# Patient Record
Sex: Female | Born: 1950 | Race: White | Hispanic: No | Smoking: Former smoker
Health system: Southern US, Community
[De-identification: ages and names within clinical notes are randomized; demographics above are authoritative.]

## PROBLEM LIST (undated history)

## (undated) DIAGNOSIS — IMO0001 Reserved for inherently not codable concepts without codable children: Secondary | ICD-10-CM

## (undated) DIAGNOSIS — Z5189 Encounter for other specified aftercare: Secondary | ICD-10-CM

## (undated) DIAGNOSIS — E669 Obesity, unspecified: Secondary | ICD-10-CM

## (undated) DIAGNOSIS — F988 Other specified behavioral and emotional disorders with onset usually occurring in childhood and adolescence: Secondary | ICD-10-CM

## (undated) DIAGNOSIS — N200 Calculus of kidney: Secondary | ICD-10-CM

## (undated) DIAGNOSIS — B182 Chronic viral hepatitis C: Secondary | ICD-10-CM

## (undated) HISTORY — DX: Calculus of kidney: N20.0

## (undated) HISTORY — DX: Chronic viral hepatitis C: B18.2

## (undated) HISTORY — DX: Encounter for other specified aftercare: Z51.89

## (undated) HISTORY — DX: Reserved for inherently not codable concepts without codable children: IMO0001

## (undated) HISTORY — DX: Other specified behavioral and emotional disorders with onset usually occurring in childhood and adolescence: F98.8

## (undated) HISTORY — DX: Obesity, unspecified: E66.9

## (undated) HISTORY — PX: RHINOPLASTY: SUR1284

---

## 1979-07-28 HISTORY — PX: OTHER SURGICAL HISTORY: SHX169

## 1998-12-13 ENCOUNTER — Other Ambulatory Visit: Admission: RE | Admit: 1998-12-13 | Discharge: 1998-12-13 | Payer: Self-pay | Admitting: *Deleted

## 2000-04-13 ENCOUNTER — Other Ambulatory Visit: Admission: RE | Admit: 2000-04-13 | Discharge: 2000-04-13 | Payer: Self-pay | Admitting: *Deleted

## 2000-06-10 ENCOUNTER — Other Ambulatory Visit: Admission: RE | Admit: 2000-06-10 | Discharge: 2000-06-10 | Payer: Self-pay | Admitting: *Deleted

## 2000-07-22 ENCOUNTER — Other Ambulatory Visit: Admission: RE | Admit: 2000-07-22 | Discharge: 2000-07-22 | Payer: Self-pay | Admitting: *Deleted

## 2000-07-22 ENCOUNTER — Encounter (INDEPENDENT_AMBULATORY_CARE_PROVIDER_SITE_OTHER): Payer: Self-pay

## 2001-01-28 ENCOUNTER — Other Ambulatory Visit: Admission: RE | Admit: 2001-01-28 | Discharge: 2001-01-28 | Payer: Self-pay | Admitting: *Deleted

## 2002-06-27 ENCOUNTER — Other Ambulatory Visit: Admission: RE | Admit: 2002-06-27 | Discharge: 2002-06-27 | Payer: Self-pay | Admitting: *Deleted

## 2002-12-18 ENCOUNTER — Other Ambulatory Visit: Admission: RE | Admit: 2002-12-18 | Discharge: 2002-12-18 | Payer: Self-pay | Admitting: *Deleted

## 2004-02-27 ENCOUNTER — Emergency Department (HOSPITAL_COMMUNITY): Admission: EM | Admit: 2004-02-27 | Discharge: 2004-02-27 | Payer: Self-pay | Admitting: Emergency Medicine

## 2005-12-06 ENCOUNTER — Emergency Department (HOSPITAL_COMMUNITY): Admission: EM | Admit: 2005-12-06 | Discharge: 2005-12-06 | Payer: Self-pay | Admitting: Emergency Medicine

## 2006-04-06 ENCOUNTER — Ambulatory Visit: Payer: Self-pay | Admitting: Gastroenterology

## 2006-05-10 ENCOUNTER — Encounter (INDEPENDENT_AMBULATORY_CARE_PROVIDER_SITE_OTHER): Payer: Self-pay | Admitting: *Deleted

## 2006-05-10 ENCOUNTER — Ambulatory Visit (HOSPITAL_COMMUNITY): Admission: RE | Admit: 2006-05-10 | Discharge: 2006-05-10 | Payer: Self-pay | Admitting: Gastroenterology

## 2006-05-21 ENCOUNTER — Ambulatory Visit: Payer: Self-pay | Admitting: Gastroenterology

## 2006-07-01 ENCOUNTER — Ambulatory Visit: Payer: Self-pay | Admitting: Gastroenterology

## 2007-12-07 ENCOUNTER — Encounter: Admission: RE | Admit: 2007-12-07 | Discharge: 2007-12-07 | Payer: Self-pay | Admitting: Internal Medicine

## 2008-01-24 ENCOUNTER — Encounter: Admission: RE | Admit: 2008-01-24 | Discharge: 2008-01-24 | Payer: Self-pay | Admitting: Gastroenterology

## 2008-05-10 ENCOUNTER — Ambulatory Visit: Payer: Self-pay | Admitting: Gastroenterology

## 2008-05-22 ENCOUNTER — Ambulatory Visit: Payer: Self-pay | Admitting: Gastroenterology

## 2008-07-10 ENCOUNTER — Ambulatory Visit: Payer: Self-pay | Admitting: Gastroenterology

## 2008-07-24 ENCOUNTER — Ambulatory Visit: Payer: Self-pay | Admitting: Gastroenterology

## 2008-08-07 ENCOUNTER — Ambulatory Visit: Payer: Self-pay | Admitting: Gastroenterology

## 2008-09-04 ENCOUNTER — Ambulatory Visit: Payer: Self-pay | Admitting: Gastroenterology

## 2008-10-02 ENCOUNTER — Ambulatory Visit: Payer: Self-pay | Admitting: Gastroenterology

## 2008-10-30 ENCOUNTER — Ambulatory Visit: Payer: Self-pay | Admitting: Gastroenterology

## 2008-11-29 ENCOUNTER — Ambulatory Visit: Payer: Self-pay | Admitting: Gastroenterology

## 2009-02-05 ENCOUNTER — Ambulatory Visit: Payer: Self-pay | Admitting: Gastroenterology

## 2009-05-09 ENCOUNTER — Ambulatory Visit: Payer: Self-pay | Admitting: Gastroenterology

## 2009-09-06 ENCOUNTER — Ambulatory Visit: Payer: Self-pay | Admitting: Family Medicine

## 2009-09-27 ENCOUNTER — Ambulatory Visit: Payer: Self-pay | Admitting: Family Medicine

## 2009-09-30 ENCOUNTER — Encounter: Admission: RE | Admit: 2009-09-30 | Discharge: 2009-09-30 | Payer: Self-pay | Admitting: Family Medicine

## 2009-11-07 ENCOUNTER — Ambulatory Visit: Payer: Self-pay | Admitting: Gastroenterology

## 2010-10-24 ENCOUNTER — Encounter (INDEPENDENT_AMBULATORY_CARE_PROVIDER_SITE_OTHER): Admitting: Family Medicine

## 2010-10-24 DIAGNOSIS — Z23 Encounter for immunization: Secondary | ICD-10-CM

## 2010-10-24 DIAGNOSIS — B1710 Acute hepatitis C without hepatic coma: Secondary | ICD-10-CM

## 2010-10-24 DIAGNOSIS — Z79899 Other long term (current) drug therapy: Secondary | ICD-10-CM

## 2010-10-24 DIAGNOSIS — F988 Other specified behavioral and emotional disorders with onset usually occurring in childhood and adolescence: Secondary | ICD-10-CM

## 2010-10-24 DIAGNOSIS — E669 Obesity, unspecified: Secondary | ICD-10-CM

## 2010-12-12 NOTE — Op Note (Signed)
Barbara Cunningham, Barbara Cunningham            ACCOUNT NO.:  [id number]   MEDICAL RECORD NO.:  [id number]          PATIENT TYPE:  AMB   LOCATION:  ENDO                         FACILITY:  [facility]   PHYSICIAN:  Anselmo Rod, M.D.  DATE OF BIRTH:  11/18/1950   DATE OF PROCEDURE:  05/10/2006  DATE OF DISCHARGE:  05/10/2006                                 OPERATIVE REPORT   OPERATIVE PROCEDURE:  Colonoscopy with snare polypectomy x1.   ENDOSCOPIST:  Anselmo Rod, M.D.   INSTRUMENT USED:  Olympus Video Colonoscopy.   INDICATIONS FOR PROCEDURE:  A 60 year old white female with a personal  history of hepatitis C undergoing screening colonoscopy to rule out colonic  polyps, masses, etc.   PRE-PROCEDURE PREPARATION:  Informed consent was procured from the patient.  The patient fasted for four hours prior to the procedure and prepped with 20  OsmoPrep overnight and 12 OsmoPrep on the morning of the procedure.  Risks  and benefits of the procedure including a 10% missed rate of cancer or polyp  were discussed with the patient as well.   PRE-PROCEDURE PHYSICAL:  The patient had stable vital signs.  Neck supple.  Chest clear to auscultation.  S1 and S2 regular.  Abdomen soft with normal  bowel sounds.   DESCRIPTION OF PROCEDURE:  The patient was placed in the left lateral  decubitus position, sedated with 100 mcg of Fentanyl and 10 mg of Versed in  incremental doses.  Once the patient was adequately sedated, maintained on  low flow oxygen and continuous cardiac monitoring the Olympus video  colonoscope was advanced from the rectum to the cecum.  The patient had a  poor prep.  Visualization was inadequate.  Multiple washes were done.  The  patient's position was changed from the left lateral to the supine position  to reach the cecal base.  A flat polyp was snared from the distal right  colon (hot snare x1).  No other masses or polyps were seen.  There was no  evidence of diverticulosis.   Retroflexion in the rectum revealed no  abnormalities.  The patient tolerated the procedure well without immediate  complications.   IMPRESSION:  1. Flat polyps snared from distal right colon.  2. No evidence of diverticulosis.  3. Large amount of residual stool in the colon.  Small lesions could be      missed.   RECOMMENDATIONS:  1. Await pathology results.  2. Avoid all nonsteroidal's including aspirin for the next two weeks.  3. Repeat colonoscopy depending on pathology results.  4. Outpatient follow-up as the need arises in the future.      Anselmo Rod, M.D.  Electronically Signed     JNM/MEDQ  D:  05/11/2006  T:  05/13/2006  Job:  [id number]   cc:   Olene Craven, M.D.

## 2010-12-29 ENCOUNTER — Telehealth: Payer: Self-pay | Admitting: Family Medicine

## 2010-12-29 MED ORDER — AMPHETAMINE-DEXTROAMPHETAMINE 20 MG PO TABS: 10.0000 mg | ORAL_TABLET | Freq: Every day | ORAL | Status: DC

## 2010-12-29 NOTE — Telephone Encounter (Signed)
Adderall renewed.

## 2011-01-08 ENCOUNTER — Ambulatory Visit (INDEPENDENT_AMBULATORY_CARE_PROVIDER_SITE_OTHER): Admitting: Medical

## 2011-01-08 ENCOUNTER — Encounter: Payer: Self-pay | Admitting: Medical

## 2011-01-08 VITALS — BP 120/84 | HR 64 | Temp 97.8°F | Ht 66.0 in | Wt 268.0 lb

## 2011-01-08 DIAGNOSIS — N23 Unspecified renal colic: Secondary | ICD-10-CM

## 2011-01-08 DIAGNOSIS — R109 Unspecified abdominal pain: Secondary | ICD-10-CM

## 2011-01-08 LAB — POCT URINALYSIS DIPSTICK
Glucose, UA: NEGATIVE
pH, UA: 5

## 2011-01-08 MED ORDER — OXYCODONE-ACETAMINOPHEN 5-325 MG PO TABS: 1.0000 | ORAL_TABLET | Freq: Four times a day (QID) | ORAL | Status: DC | PRN

## 2011-01-08 MED ORDER — PROMETHAZINE HCL 25 MG PO TABS: 25.0000 mg | ORAL_TABLET | Freq: Four times a day (QID) | ORAL | Status: AC | PRN

## 2011-01-08 NOTE — Progress Notes (Signed)
  Subjective:   HPI  Barbara Cunningham is a 60 y.o. female who presents for 2 day history of intermittent moderate to severe left flank pain, and nothing improves the pain. The pain can occur regardless of position or motion, and feels deep. She notes a history of renal stones, had a procedure to remove stones back in 1981, and she notes over 15 episodes of renal stones in the past, always on the left. At times the pain is excruciating, feels like her kidney will explode. She denies any recent heavy lifting or strenuous activity, no recent trauma or injury. She denies vaginal symptoms or GI symptoms.  No other aggravating or relieving factors.  No other c/o.  The following portions of the patient's history were reviewed and updated as appropriate: allergies, current medications, past family history, past medical history, past social history, past surgical history and problem list.  Past Medical History  Diagnosis Date  . Hepatitis c, chronic   . ADD (attention deficit disorder)   . Obesity   . Renal stone     Chronic, 15+ prior    Review of Systems Constitutional: denies fever, chills, sweats, unexpected weight change, anorexia, fatigue Dermatology: denies rash Cardiology: denies chest pain, palpitations, edema Respiratory: denies cough, shortness of breath, wheezing Gastroenterology: + Left flank pain, one episode of nausea and vomiting yesterday. denies diarrhea, constipation Hematology: denies bleeding or bruising problems Musculoskeletal: denies arthralgias, myalgias, joint swelling Urology: denies dysuria, difficulty urinating, hematuria, urinary frequency, urgency Neurology: no headache, weakness, tingling, numbness    Objective:   Physical Exam  Filed Vitals:   01/08/11 1131  BP: 120/84  Pulse: 64  Temp: 97.8 F (36.6 C)    General appearance: alert, no distress, WD/WN, in pain at times, obese female Heart: RRR, normal S1, S2, no murmurs Lungs: CTA bilaterally, no  wheezes, rhonchi, or rales Abdomen: +bs, soft, non tender, non distended, no masses, no hepatomegaly, no splenomegaly Back: non tender, but in pain at times regardless of position Pulses: 2+ symmetric, upper and lower extremities, normal cap refill Neurological: strength normal upper extremities and lower extremities   Assessment :    Encounter Diagnoses  Name Primary?  . Renal colic on left side Yes  . Abdominal pain      Plan:    Her urinalysis is unremarkable today. She was here in March of 2011 with very similar history, exam, and urine findings. However, she had a CT abdomen and pelvis 09/30/09 with left nephrolithiasis. She had recent lab work here including renal studies and blood count which were normal. I suspect she has recurrence currently. I advised plenty of water for hydration, gave prescription for pain and antinausea medication, advised if worse over the next 2-3 days call, return, or go to the emergency department. Given her history of recurrent renal stones, we will refer to [facility].  She saw a urologist prior, and other studies were recommended, but at that time she was having studies for her hepatitis C and her renal colic symptoms resolved, thus she didn't pursue further workup at that time.

## 2011-01-08 NOTE — Patient Instructions (Signed)
Ureteral Colic (Kidney Stones) Your pain is caused by a stone located in the urinary tract system. These are the channels that collect the urine from your kidneys and deliver it to your bladder. These stones usually pass through the urinary tract and are expelled in the urine. The intense pain is caused by stone movement in those channels when the ureter goes into spasm around the stone. This pain (ureteral colic) is an intermittent pain. It comes and goes as the ureter contracts around the stone. The pain is usually located in the back and abdomen (belly). It is an intense, sharp, stabbing pain. It begins up high near the kidney and gradually moves down the ureter. When the stone is ready to pass into the bladder the pain is often located in the lower abdomen on the side the stone is located. Once the pain is located here it often passes into the bladder and disappears completely. Once the stone is in the bladder, it can pass out in the urine. This happens because the opening out of the bladder is larger than the path the stone has come down through the ureters. While the stone is passing, some relief can be obtained by pressing down on the area of pain with your hands or pressing a pillow down over the area. There is no certain position you can get in to give yourself pain relief. TREATMENT  Your caregiver will provide you with medications for pain relief.   You may require specialized follow-up x-rays. The absence of pain does not always mean that the stone has passed. It may have just stopped moving. If the urine remains completely obstructed, it can cause loss of kidney function or even complete destruction of the involved kidney. It is your responsibility and in your interest that x-rays and follow-ups as suggested by your caregiver are completed. Relief of pain without passage of the stone can be associated with severe damage to the kidney, including loss of kidney function on that side.   If your  stone does not pass on its own, additional measures may be taken by your caregiver to ensure its removal.  HOME CARE INSTRUCTIONS  Increase your fluid intake.   Strain all urine. A strainer will be provided. Keep all particulate matter or stones for your caregiver to inspect. In spite of the intense pain the stone causes it may be smaller than a BB (4.35 mm in diameter).   Take your pain medication as directed.   Make a follow-up appointment with your caregiver as directed.   Remember that the goal is passage of your stone. The absence of pain in and of itself is not the endpoint. Silent obstruction can and does happen. To avoid the serious consequences of such obstruction, it is absolutely necessary to abide by the plan to which you and your physician have agreed.  SEEK MEDICAL CARE IF:  Pain cannot be controlled with the prescribed medication.   Fever develops. Backed up urine is more likely to become infected. Only take over-the-counter or prescription medicines for pain, discomfort, or fever as directed by your caregiver.   Pain continues for more than 101.   There is a change in the pain, and you develop chest discomfort or constant abdominal pain.   You feel faint or pass out.  MAKE SURE YOU:   Understand these instructions.   Will watch your condition.   Will get help right away if you are not doing well or get worse.  Document Released:  04/22/2005 Document Re-Released: 05/10/2009 [employer] Patient Information 2011 [place], [place].

## 2011-01-10 LAB — URINE CULTURE: Colony Count: 30000

## 2011-01-12 ENCOUNTER — Telehealth: Payer: Self-pay | Admitting: *Deleted

## 2011-01-12 NOTE — Telephone Encounter (Addendum)
Message copied by Dorthula Perfect on Mon Jan 12, 2011 11:09 AM ------      Message from: Jac Canavan      Created: Mon Jan 12, 2011  8:19 AM       Urine culture was + for infection.  Call and see if she feels any better, worse, unchanged?  Call out Macrobid 100mg  BID x 7 days #14, no refill.   Pt notified of urine culture.  Called in  Macrobid 100mg  BID for 7 days #14 with no refills to [facility] at [phone].  CM, LPN

## 2011-01-19 ENCOUNTER — Other Ambulatory Visit (INDEPENDENT_AMBULATORY_CARE_PROVIDER_SITE_OTHER)

## 2011-01-19 DIAGNOSIS — N390 Urinary tract infection, site not specified: Secondary | ICD-10-CM

## 2011-01-19 DIAGNOSIS — R300 Dysuria: Secondary | ICD-10-CM

## 2011-01-19 LAB — POCT URINALYSIS DIPSTICK
Bilirubin, UA: NEGATIVE
Blood, UA: NEGATIVE
Ketones, UA: NEGATIVE
Spec Grav, UA: 1.02
pH, UA: 5

## 2011-01-20 ENCOUNTER — Telehealth: Payer: Self-pay | Admitting: *Deleted

## 2011-01-20 ENCOUNTER — Other Ambulatory Visit

## 2011-01-20 DIAGNOSIS — M549 Dorsalgia, unspecified: Secondary | ICD-10-CM

## 2011-01-20 MED ORDER — CIPROFLOXACIN HCL 500 MG PO TABS: 500.0000 mg | ORAL_TABLET | Freq: Two times a day (BID) | ORAL | Status: AC

## 2011-01-20 MED ORDER — HYDROCODONE-ACETAMINOPHEN 5-500 MG PO TABS: 1.0000 | ORAL_TABLET | Freq: Three times a day (TID) | ORAL | Status: AC | PRN

## 2011-01-20 NOTE — Telephone Encounter (Signed)
Called in cipro 500 mg and Lortab 5/500 mg to [facility].  Pt aware.  CM LPN

## 2011-01-20 NOTE — Telephone Encounter (Addendum)
Message copied by Dorthula Perfect on Tue Jan 20, 2011 10:10 AM ------      Message from: Aleen Campi, DAVID S      Created: Mon Jan 19, 2011  7:44 PM       i had spoken to pt last wk about ongoing back pain.  She had seen urology who didn't think the stones were the problem, but rather lumbar musculoskeletal issue.  Advised she come in for repeat UA if still having pain.  If negative for UTI and no improvement, then recheck.   At this point her urine just shows some bacteria. Thus, if still in a lot of pain, recheck OV.   Spoke with pt regarding urinalysis results.  Pt c/o same symptoms.  Would like another round of antibiotics.  Will send ua for culture. CM, LPN

## 2011-01-20 NOTE — Telephone Encounter (Signed)
Call out (and order in epic) the following:  A - Cipro 500mg  1 tablet BID x 5 days, #10, no refill.  B - Lortab 5/500mg  1 tablet TID prn pain #15, no refill. If not resolving, return for recheck.

## 2011-01-21 ENCOUNTER — Other Ambulatory Visit: Payer: Self-pay | Admitting: Medical

## 2011-01-21 ENCOUNTER — Telehealth: Payer: Self-pay | Admitting: Medical

## 2011-01-21 MED ORDER — AMPHETAMINE-DEXTROAMPHETAMINE 20 MG PO TABS: 20.0000 mg | ORAL_TABLET | Freq: Two times a day (BID) | ORAL | Status: DC

## 2011-01-21 NOTE — Telephone Encounter (Signed)
rx ready 

## 2011-01-21 NOTE — Telephone Encounter (Signed)
pls pull chart 

## 2011-01-22 LAB — URINE CULTURE: Colony Count: NO GROWTH

## 2011-01-22 NOTE — Telephone Encounter (Signed)
Called pt to pick up rx

## 2011-01-23 ENCOUNTER — Telehealth: Payer: Self-pay | Admitting: *Deleted

## 2011-01-23 NOTE — Telephone Encounter (Addendum)
Message copied by Dorthula Perfect on Fri Jan 23, 2011  9:20 AM ------      Message from: Jac Canavan      Created: Fri Jan 23, 2011  7:42 AM       Urine culture negative.  If still having back/belly pain, recheck/OV   Pt notified of urine culture results.  Pain is easing up and will call to schedule OV if needed.  CM, LPN

## 2011-03-03 ENCOUNTER — Telehealth: Payer: Self-pay | Admitting: Family Medicine

## 2011-03-03 MED ORDER — AMPHETAMINE-DEXTROAMPHETAMINE 20 MG PO TABS: 20.0000 mg | ORAL_TABLET | Freq: Two times a day (BID) | ORAL | Status: DC

## 2011-03-03 NOTE — Telephone Encounter (Signed)
Needs refill Adderall 20mg   #60    1bid.  Pt daughter coming in [place] after lunch.  Can you give her the written rx for her mom.

## 2011-05-08 ENCOUNTER — Telehealth: Payer: Self-pay | Admitting: Family Medicine

## 2011-05-08 MED ORDER — AMPHETAMINE-DEXTROAMPHETAMINE 20 MG PO TABS: 20.0000 mg | ORAL_TABLET | Freq: Two times a day (BID) | ORAL | Status: DC

## 2011-05-08 NOTE — Telephone Encounter (Signed)
Adderall renewed.

## 2011-05-12 ENCOUNTER — Other Ambulatory Visit: Payer: Self-pay

## 2011-05-12 LAB — HM MAMMOGRAPHY: HM Mammogram: ABNORMAL

## 2011-05-13 HISTORY — PX: BREAST BIOPSY: SHX20

## 2011-05-15 ENCOUNTER — Encounter (INDEPENDENT_AMBULATORY_CARE_PROVIDER_SITE_OTHER): Payer: Self-pay | Admitting: General Surgery

## 2011-05-18 ENCOUNTER — Ambulatory Visit (INDEPENDENT_AMBULATORY_CARE_PROVIDER_SITE_OTHER): Admitting: General Surgery

## 2011-05-18 ENCOUNTER — Encounter (INDEPENDENT_AMBULATORY_CARE_PROVIDER_SITE_OTHER): Payer: Self-pay | Admitting: General Surgery

## 2011-05-18 VITALS — BP 142/84 | HR 68 | Temp 97.6°F | Resp 16 | Ht 66.0 in | Wt 271.0 lb

## 2011-05-18 DIAGNOSIS — N6089 Other benign mammary dysplasias of unspecified breast: Secondary | ICD-10-CM

## 2011-05-18 DIAGNOSIS — N6099 Unspecified benign mammary dysplasia of unspecified breast: Secondary | ICD-10-CM

## 2011-05-18 NOTE — Patient Instructions (Addendum)
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 You will be scheduled for surgery to  excisise  this area to be sure that there is no cancer within the breast.  Breast Biopsy WHY YOU NEED A BIOPSY Your caregiver has recommended that you have a breast tissue sample taken (biopsy). This is done to be certain that the lump or abnormality found in your breast is not cancerous (malignant). During a biopsy, a small piece of tissue is removed, so it can be examined under a microscope by a specialist (pathologist) who looks at tissues and cells and diagnoses abnormalities in them. Most lumps (tumors) or abnormalities, on or in the breast, are not cancerous (benign). However, biopsies are taken when your caregiver cannot be absolutely certain of what is wrong only from doing a physical exam, mammogram (breast X-ray), or other studies. A breast biopsy can tell you whether nothing more needs to be done, or you need more surgery or another type of treatment. A biopsy is done when there is:  Any undiagnosed breast mass.   Nipple abnormalities, dimpling, crusting, or ulcerations.   Calcium deposits (calcifications) or abnormalities seen on your mammogram, ultrasound, or MRI.   Suspicious changes in the breast (thickening, asymmetry) seen on mammogram.   Abnormal discharge from the nipple, especially blood.   Redness, swelling, and pain of the breast.  HOW A BIOPSY IS PERFORMED A biopsy is often performed on an outpatient basis (you go home the same day). This can be done in a hospital, clinic, or surgical center. Tissue samples (biopsies) are often done under local anesthesia (area is numbed). Sometimes general anesthetics are required, in which case you sleep through the procedure. Biopsies may remove the entire lump, a small piece of the lump, or a small sliver of tissue removed by needle. TYPES OF BREAST BIOPSY  Fine needle aspiration. A thin needle is placed through the skin, to  the lump or cyst, and cells are removed.   Core needle biopsy. A large needle with a special tip is placed through the skin, to the abnormality, and a piece of tissue is removed.   Stereotactic biopsy. A core needle with a special X-ray is used, to direct the needle to the lump or abnormal area, which is difficult to feel or cannot be felt.   Vacuum-assisted biopsy. A hollow probe and a gentle vacuum remove a sample of tissue.   Ultrasound guided core needle biopsy. You lie on your stomach, with your breast through an opening, and a high frequency ultrasound helps guide the needle to the area of the abnormality.   Open biopsy. An incision is made in the breast, and a piece of the lump or the whole lump is removed.  LET YOUR CAREGIVER KNOW ABOUT:  Allergies.   Medicines taken, including herbs, eye drops, over-the-counter medicines, and creams.   Use of steroids (by mouth or creams).   Previous problems with anesthetics or Novocaine.   If you are taking aspirin or blood thinners.   Possibility of pregnancy, if this applies.   History of blood clots (thrombophlebitis).   History of bleeding or blood problems.   Previous surgery.   Other health problems.  RISKS AND COMPLICATIONS   Bleeding.   Infection.   Allergy to medicines.   Bruising and swelling of the breast.   Alteration in the shape of the breast.   Not finding the lump or abnormality.   Needing more surgery.  BEFORE THE PROCEDURE  You  should arrive 60 minutes prior to your procedure or as directed.   Check-in at the admissions desk, to fill out necessary forms, if you are not preregistered.   There will be consent forms to sign, prior to the procedure.   There is a waiting area for your family, while you are having your biopsy.   Try to have someone with you, to drive you home.   Do not smoke for 2 weeks before the surgery.   Let your caregiver know if you develop a cold or an infection.   Do not  drink alcohol for at least 24 hours before surgery.   Wear a good support bra to the surgery.  AFTER THE PROCEDURE  After surgery, you will be taken to the recovery area, where a nurse will watch and check your progress. Once you are awake, stable, and taking fluids well, if there are no other problems, you will be allowed to go home.   Ice packs applied to your operative site may help with discomfort and keep the swelling down.   You may resume normal diet and activities as directed. Avoid strenuous activities affecting the arm on the side of the biopsy, such as tennis, swimming, heavy lifting (more than 10 pounds) or pulling.   Bruising in the breast is normal following this procedure.   Wearing a support bra, even to bed, may be more comfortable. The bra will also help keep the dressing on.   Change dressings as directed.   Your doctor may apply a pressure dressing on your breast for 24 to 48 hours.   Only take over-the-counter or prescription medicines for pain, discomfort, or fever as directed by your caregiver.   Do not take aspirin, because it can cause bleeding.  HOME CARE INSTRUCTIONS   You may resume your usual diet.   Have someone drive you home after the surgery.   Do not do any exercise, driving, lifting or general activities without your caregiver's permission.   Take medicines and over-the-counter medicines, as ordered by your caregiver.   Keep your postoperative appointments as recommended.   Do not drink alcohol while taking pain medicine.  Finding out the results of your test Not all test results are available during your visit. If your test results are not back during the visit, make an appointment with your caregiver to find out the results. Do not assume everything is normal if you have not heard from your caregiver or the medical facility. It is important for you to follow up on all of your test results.  SEEK MEDICAL CARE IF:   You notice redness,  swelling, or increasing pain in the wound.   You notice a bad smell coming from the wound or dressing.   You develop a rash.   You need stronger pain medicine.   You are having an allergic reaction or problems with your medicines.  SEEK IMMEDIATE MEDICAL CARE IF:   You have difficulty breathing.   You have a fever.   There is increased bleeding (more than a small spot) from the wound.   Pus is coming from the wound.   The wound is breaking open.  Document Released: 07/13/2005 Document Revised: 03/25/2011 Document Reviewed: 05/31/2009 [facility] Patient Information 2012 [place], [place].

## 2011-05-18 NOTE — Progress Notes (Signed)
Chief Complaint  Patient presents with  . Other    new pt- eval lt br atypical ductal hyperplacia    HPI Barbara Cunningham is a 60 y.o. female.    This 60 year old Caucasian female was referred by Dr. 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 Her primary care physician is Dr. Sharlot Gowda..  The patient went for screening mammograms in April. She has no symptoms. She was called back 6 months later for followup. Six-month followup on October 16 shows a 5 mm irregular lobulated density at the 6:00 position of the left breast. 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 She was referred for excision to occlude to exclude occult carcinoma.  Family history is negative for breast cancer and is negative for ovarian cancer.  Patient has a past history of hepatitis C underwent treatment by [facility] . Has no liver decompensation. Other medical issues include the ADD, obesity, kidney stones, it she's had 2 C-sections.   HPI  Past Medical History  Diagnosis Date  . Hepatitis c, chronic   . ADD (attention deficit disorder)   . Obesity   . Renal stone     Chronic, 15+ prior  . Blood transfusion     Past Surgical History  Procedure Date  . Renal stone surgery 1981  . Rhinoplasty   . Cesarean section 1979, 1981    Family History  Problem Relation Age of Onset  . Vision loss Father     Social History History  Substance Use Topics  . Smoking status: Former Games developer  . Smokeless tobacco: Never Used   Comment: quit 2008  . Alcohol Use: No    No Known Allergies  Current Outpatient Prescriptions  Medication Sig Dispense Refill  . amphetamine-dextroamphetamine (ADDERALL, 20MG ,) 20 MG tablet Take 1 tablet (20 mg total) by mouth 2 (two) times daily.  60 tablet  0    Review of Systems Review of Systems  Constitutional: Negative for fever, chills and unexpected weight change.  HENT: Negative for  hearing loss, congestion, sore throat, trouble swallowing and voice change.   Eyes: Negative for visual disturbance.  Respiratory: Negative for cough and wheezing.   Cardiovascular: Negative for chest pain, palpitations and leg swelling.  Gastrointestinal: Negative for nausea, vomiting, abdominal pain, diarrhea, constipation, blood in stool, abdominal distention and anal bleeding.  Genitourinary: Negative for hematuria, vaginal bleeding and difficulty urinating.  Musculoskeletal: Negative for arthralgias.  Skin: Negative for rash and wound.  Neurological: Negative for seizures, syncope and headaches.  Hematological: Negative for adenopathy. Does not bruise/bleed easily.       Blood transfusion 1976.  Psychiatric/Behavioral: Negative for confusion.    Blood pressure 142/84, pulse 68, temperature 97.6 F (36.4 C), temperature source Temporal, resp. rate 16, height 5\' 6"  (1.676 m), weight 271 lb (122.925 kg).  Physical Exam Physical Exam  Constitutional: She is oriented to person, place, and time. She appears well-developed and well-nourished. No distress.  HENT:  Head: Normocephalic and atraumatic.  Nose: Nose normal.  Mouth/Throat: No oropharyngeal exudate.  Eyes: Conjunctivae and EOM are normal. Pupils are equal, round, and reactive to light. Left eye exhibits no discharge. No scleral icterus.  Neck: Neck supple. No JVD present. No tracheal deviation present. No thyromegaly present.  Cardiovascular: Normal rate, regular rhythm, normal heart sounds and intact distal pulses.   No murmur heard. Pulmonary/Chest: Effort normal and breath sounds normal. No respiratory distress. She has no wheezes.  She has no rales. She exhibits no tenderness.       No breast mass, nipple discharge, skin change or adenopathy.  Abdominal: Soft. Bowel sounds are normal. She exhibits no distension and no mass. There is no tenderness. There is no rebound and no guarding.       pfannensteil scar.    Musculoskeletal: She exhibits no edema and no tenderness.  Lymphadenopathy:    She has no cervical adenopathy.  Neurological: She is alert and oriented to person, place, and time. She exhibits normal muscle tone. Coordination normal.  Skin: Skin is warm. No rash noted. She is not diaphoretic. No erythema. No pallor.  Psychiatric: She has a normal mood and affect. Her behavior is normal. Judgment and thought content normal.    Data Reviewed    Assessment   Left breast mass 6:00 position. Atypical ductal hyperplasia by image guided biopsy. Complete excision is indicated to exclude carcinoma.  Obesity  Hepatitis C  Obesity  ADD.     Plan    We'll schedule for left partial mastectomy with needle localization in the near future. This may require a radially oriented vertical incision to prevent deformity.  I have discussed indications and details of surgery with her. Risk and complications have been outlined, including but not limited to bleeding, infection, reoperation for cancer, cosmetic deformity, nerve damage with chronic pain or numbness, cardiac pulmonary and thromboembolic problems. She seems to understand these issues well. The tunnel for questions are answered. She is in full agreement with this plan.       Antavious Spanos M 05/18/2011, 4:21 PM

## 2011-05-20 ENCOUNTER — Encounter (INDEPENDENT_AMBULATORY_CARE_PROVIDER_SITE_OTHER): Payer: Self-pay

## 2011-06-26 ENCOUNTER — Ambulatory Visit (HOSPITAL_BASED_OUTPATIENT_CLINIC_OR_DEPARTMENT_OTHER): Admission: RE | Admit: 2011-06-26 | Source: Ambulatory Visit | Admitting: General Surgery

## 2011-06-26 ENCOUNTER — Encounter (HOSPITAL_BASED_OUTPATIENT_CLINIC_OR_DEPARTMENT_OTHER): Admission: RE | Payer: Self-pay | Source: Ambulatory Visit

## 2011-06-26 ENCOUNTER — Ambulatory Visit (HOSPITAL_BASED_OUTPATIENT_CLINIC_OR_DEPARTMENT_OTHER): Admit: 2011-06-26 | Payer: Self-pay | Admitting: General Surgery

## 2011-06-26 SURGERY — MASTECTOMY PARTIAL
Anesthesia: General | Laterality: Left

## 2011-08-21 ENCOUNTER — Telehealth: Payer: Self-pay | Admitting: Family Medicine

## 2011-08-21 NOTE — Telephone Encounter (Signed)
Pt called wants refill on Adderall.  [phone]

## 2011-08-21 NOTE — Telephone Encounter (Signed)
Needs OV.  

## 2011-08-24 ENCOUNTER — Ambulatory Visit (INDEPENDENT_AMBULATORY_CARE_PROVIDER_SITE_OTHER): Admitting: Medical

## 2011-08-24 ENCOUNTER — Encounter: Payer: Self-pay | Admitting: Medical

## 2011-08-24 VITALS — BP 122/80 | HR 62 | Temp 98.1°F | Resp 16 | Wt 270.0 lb

## 2011-08-24 DIAGNOSIS — R6889 Other general symptoms and signs: Secondary | ICD-10-CM

## 2011-08-24 DIAGNOSIS — B1920 Unspecified viral hepatitis C without hepatic coma: Secondary | ICD-10-CM

## 2011-08-24 DIAGNOSIS — N6459 Other signs and symptoms in breast: Secondary | ICD-10-CM | POA: Insufficient documentation

## 2011-08-24 DIAGNOSIS — Z8619 Personal history of other infectious and parasitic diseases: Secondary | ICD-10-CM | POA: Insufficient documentation

## 2011-08-24 DIAGNOSIS — Z7189 Other specified counseling: Secondary | ICD-10-CM | POA: Insufficient documentation

## 2011-08-24 DIAGNOSIS — F988 Other specified behavioral and emotional disorders with onset usually occurring in childhood and adolescence: Secondary | ICD-10-CM | POA: Insufficient documentation

## 2011-08-24 MED ORDER — AMPHETAMINE-DEXTROAMPHETAMINE 20 MG PO TABS: 20.0000 mg | ORAL_TABLET | Freq: Two times a day (BID) | ORAL | Status: DC

## 2011-08-24 NOTE — Progress Notes (Signed)
  Subjective:   HPI  Barbara Cunningham is a 61 y.o. female who presents for med check.  Last visit here 09/2010.   She is mainly here for refills on ADD medication.  She uses this not daily, but prn for help with focus. She notes diagnosis back years ago as an adult when she started back to school.  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  Last refill was with Dr. Susann Givens here, Adderall 20mg  BID, #60, back in October.  Otherwise, she has no c/o.  Feeling well, no other recent problems.   Of note, last colonoscopy was with dr. Loreta Ave, unsure of date, but thinks she may be due for repeat.  She declines flu shots.  She has mammogram few moths ago, there was an abnormality, and she did go see Careers adviser.  However, there was miscommunication.   All she knew was that biopsy showed questionable cells, but then the surgeon started talking about mastectomy, and this got her worried and confused.  She did not do the surgery, and has yet to f/u.    She has hx/o hepatitis C, was going to [facility], but then after extensive treatment and no improvement, she didn't go back for therapy.  No other c/o.  The following portions of the patient's history were reviewed and updated as appropriate: allergies, current medications, past family history, past medical history, past social history, past surgical history and problem list.  Past Medical History  Diagnosis Date  . Hepatitis c, chronic   . ADD (attention deficit disorder)   . Obesity   . Renal stone     Chronic, 15+ prior  . Blood transfusion     No Known Allergies  No current outpatient prescriptions on file prior to visit.   Review of Systems Constitutional: denies fever, chills, sweats, unexpected weight change, fatigue ENT: no runny nose, ear pain, sore throat Cardiology: denies  chest pain, palpitations, edema Respiratory: denies cough, shortness of breath, wheezing Gastroenterology: denies abdominal pain, nausea, vomiting, diarrhea, constipation Musculoskeletal: denies arthralgias, myalgias, joint swelling, back pain Urology: denies dysuria, difficulty urinating, hematuria, urinary frequency, urgency Neurology: no headache, weakness, tingling, numbness      Objective:   Physical Exam  General appearance: alert, no distress, WD/WN Oral cavity: MMM, no lesions Neck: supple, no lymphadenopathy, no thyromegaly, no masses Heart: RRR, normal S1, S2, no murmurs Lungs: CTA bilaterally, no wheezes, rhonchi, or rales Pulses: 2+ symmetric, upper and lower extremities, normal cap refill   Assessment and Plan :     Encounter Diagnoses  Name Primary?  . ADD (attention deficit disorder) Yes  . Hepatitis C   . Abnormal breast finding   . Counseling on health promotion and disease prevention    ADD - she has been compliant, doing well with current medications.  Refilled medication today.    Hepatitis  C- will request prior hepatitis clinic records.    Abnormal breast finding - discussed recent biopsy and imaging results.  I advised she f/u with different surgeon for second opinion within the next 1-2 mo  Counseling - return in April for yearly physical, fasting labs, general recheck.

## 2011-08-24 NOTE — Telephone Encounter (Signed)
Patient was notified that she needs a OV before she gets a refill. Patient scheduled an appointment for today. CLS

## 2011-11-12 ENCOUNTER — Encounter: Payer: Self-pay | Admitting: Internal Medicine

## 2011-11-18 ENCOUNTER — Ambulatory Visit (INDEPENDENT_AMBULATORY_CARE_PROVIDER_SITE_OTHER): Admitting: Medical

## 2011-11-18 ENCOUNTER — Encounter: Payer: Self-pay | Admitting: Medical

## 2011-11-18 VITALS — BP 120/70 | HR 68 | Temp 98.2°F | Resp 16 | Wt 255.0 lb

## 2011-11-18 DIAGNOSIS — F988 Other specified behavioral and emotional disorders with onset usually occurring in childhood and adolescence: Secondary | ICD-10-CM

## 2011-11-18 DIAGNOSIS — B1920 Unspecified viral hepatitis C without hepatic coma: Secondary | ICD-10-CM

## 2011-11-18 DIAGNOSIS — R6889 Other general symptoms and signs: Secondary | ICD-10-CM

## 2011-11-18 DIAGNOSIS — N6459 Other signs and symptoms in breast: Secondary | ICD-10-CM

## 2011-11-18 MED ORDER — AMPHETAMINE-DEXTROAMPHETAMINE 20 MG PO TABS: 20.0000 mg | ORAL_TABLET | Freq: Two times a day (BID) | ORAL | Status: DC

## 2011-11-18 NOTE — Progress Notes (Signed)
Subjective:   HPI  Barbara Cunningham is a 61 y.o. female who presents for med check.  She first notes that she is here for physical, never followed up as discussed last visit.  She needs refills on ADD medication.  She uses this most days BID for help with focus. She notes diagnosis back years ago as an adult when she started back to school.  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    Her appt today was scheduled for f/u, not a physical, and she has to be at another doctor's office in 10 minutes.    She says since last visit though she is using Shakeology drinks to hlep lose weight in conjunction with exercise.  She has lost 15 lbs since last visit.   She is motivated to lose more weight.  She notes hx/o ongoing pain from her left kidney, hx/o renal problems, was getting frequent pain, but since going on the Shakeology plan, she has had no pain at all.   She has hx/o hepatitis C, was going to [facility], but then after extensive treatment and no improvement, she didn't go back for therapy.  No other c/o.  She was suppose to get me contact info regarding her last hepatitis clinic visit last visit but just now has the info for me today.     Of note, last colonoscopy was with dr. Loreta Ave, unsure of date, thinks it was 5 years ago. She declines flu shots.  She has mammogram few moths ago, there was an abnormality, and she did go see Careers adviser.  However, there was miscommunication.   All she knew was that biopsy showed questionable cells, but then the surgeon started talking about mastectomy, and this got her worried and confused.  She did not do the surgery, and has yet to f/u.    The following portions of the patient's history were reviewed and updated as appropriate: allergies, current medications, past family history, past  medical history, past social history, past surgical history and problem list.  Past Medical History  Diagnosis Date  . Hepatitis c, chronic   . ADD (attention deficit disorder)   . Obesity   . Renal stone     Chronic, 15+ prior  . Blood transfusion     No Known Allergies  Current Outpatient Prescriptions on File Prior to Visit  Medication Sig Dispense Refill  . DISCONTD: amphetamine-dextroamphetamine (ADDERALL, 20MG ,) 20 MG tablet Take 1 tablet (20 mg total) by mouth 2 (two) times daily.  60 tablet  0   Review of Systems Constitutional: denies fever, chills, sweats, unexpected weight change, fatigue ENT: no runny nose, ear pain, sore throat Cardiology: denies chest pain, palpitations, edema Respiratory: denies cough, shortness of breath, wheezing Gastroenterology: denies abdominal pain, nausea, vomiting, diarrhea, constipation Musculoskeletal: denies arthralgias, myalgias, joint swelling, back pain Urology: denies dysuria, difficulty urinating, hematuria, urinary frequency, urgency Neurology: no headache, weakness, tingling, numbness      Objective:   Physical Exam  General appearance: alert, no distress, WD/WN Oral cavity: MMM, no lesions Neck: supple, no lymphadenopathy, no thyromegaly, no masses Heart: RRR, normal S1, S2, no murmurs Lungs: CTA bilaterally, no wheezes, rhonchi, or rales Pulses: 2+ symmetric, upper and lower extremities, normal cap refill   Assessment and Plan :     Encounter Diagnoses  Name Primary?  . ADD (attention deficit disorder) Yes  . Hepatitis C   . Abnormal breast finding    ADD - she has been compliant, doing well with current medications.  Refilled medication today.    Hepatitis  C- will request prior hepatitis clinic records.  [facility] at [phone].  Abnormal breast finding - discussed recent biopsy and imaging results.  I advised she f/u with different surgeon for second opinion within the next 1-2 mo  Counseling  - return soon, preferably within a month for physical and fasting labs including hepatitis C viral load.

## 2011-11-24 ENCOUNTER — Encounter: Payer: Self-pay | Admitting: Gastroenterology

## 2011-12-08 ENCOUNTER — Telehealth: Payer: Self-pay | Admitting: Internal Medicine

## 2011-12-08 NOTE — Telephone Encounter (Signed)
pt is stating she is in alot of pain and possible uti on the left side. pt is in [place] and couldnt fly home and missed her flight cause she was in alot of pain. pt wants to know if you can call something out [phone].  she was given OXYCODONE-acetaminophen last year for this. Pt wants to know if she needs to go to the urgent care out there. Cause she wants to be put on something before she comes home.

## 2011-12-08 NOTE — Telephone Encounter (Signed)
Urgent care would probably be best for this issue, especially if in a lot of pain.  We can't call out pain medication such as Oxycodone anyway.    However, let me know her symptoms and I can consider treatment for UTI (antibiotic).

## 2011-12-08 NOTE — Telephone Encounter (Signed)
Pt did go to the urgent care and does have some kind of kidney infection. Pt was given macrobid 100mg  and oxycodone for pain. Just wanted to let you know

## 2012-04-29 ENCOUNTER — Telehealth: Payer: Self-pay | Admitting: Family Medicine

## 2012-04-29 MED ORDER — AMPHETAMINE-DEXTROAMPHETAMINE 20 MG PO TABS: 20.0000 mg | ORAL_TABLET | Freq: Two times a day (BID) | ORAL | Status: DC

## 2012-04-29 NOTE — Telephone Encounter (Signed)
Adderall renewed.

## 2012-11-02 ENCOUNTER — Ambulatory Visit
Admission: RE | Admit: 2012-11-02 | Discharge: 2012-11-02 | Disposition: A | Discharge status: Home / Self Care | Source: Ambulatory Visit | Attending: Medical | Admitting: Medical

## 2012-11-02 ENCOUNTER — Ambulatory Visit (INDEPENDENT_AMBULATORY_CARE_PROVIDER_SITE_OTHER): Admitting: Medical

## 2012-11-02 ENCOUNTER — Other Ambulatory Visit: Payer: Self-pay | Admitting: Medical

## 2012-11-02 ENCOUNTER — Encounter: Payer: Self-pay | Admitting: Medical

## 2012-11-02 VITALS — BP 100/70 | HR 95 | Temp 99.6°F | Resp 18 | Wt 262.0 lb

## 2012-11-02 DIAGNOSIS — J189 Pneumonia, unspecified organism: Secondary | ICD-10-CM

## 2012-11-02 DIAGNOSIS — R059 Cough, unspecified: Secondary | ICD-10-CM

## 2012-11-02 DIAGNOSIS — R509 Fever, unspecified: Secondary | ICD-10-CM

## 2012-11-02 DIAGNOSIS — R05 Cough: Secondary | ICD-10-CM

## 2012-11-02 LAB — CBC WITH DIFFERENTIAL/PLATELET
Hemoglobin: 15.5 g/dL — ABNORMAL HIGH (ref 12.0–15.0)
MCHC: 32.8 g/dL (ref 30.0–36.0)
Monocytes Relative: 9 % (ref 3–12)
Neutro Abs: 4.9 10*3/uL (ref 1.7–7.7)
Neutrophils Relative %: 63 % (ref 43–77)
RBC: 5.39 MIL/uL — ABNORMAL HIGH (ref 3.87–5.11)
WBC: 7.8 10*3/uL (ref 4.0–10.5)

## 2012-11-02 MED ORDER — AZITHROMYCIN 250 MG PO TABS: ORAL_TABLET | ORAL | Status: DC

## 2012-11-02 MED ORDER — PENICILLIN G BENZATHINE 1200000 UNIT/2ML IM SUSP
1.2000 10*6.[IU] | Freq: Once | INTRAMUSCULAR | Status: AC
  Administered 2012-11-02: 1.2 10*6.[IU] via INTRAMUSCULAR

## 2012-11-02 NOTE — Progress Notes (Signed)
Subjective:  Barbara Cunningham is a 62 y.o. female with hx/o Hep C who presents for terrible cough, feels like razors in chest, fever to 102 this morning, not feeling well, fever since Monday.  Cough started Sunday night 3 days ago.   Several sick contacts in the house.  Has had some loose stools a few times, no appetite, awake all night can't get comfortable, mild nasal congestion, mild dyspnea.  Denies sore throat, no nausea or vomiting, no rash, no sinus pressure, no wheezing, no leg swelling.  Using nothing for symptoms.  Suppose to be leaving by plane tomorrow, but not sure about this now.  She is a nonsmoker, no hx/o lung disease.  No other aggravating or relieving factors.  No other c/o.  The following portions of the patient's history were reviewed and updated as appropriate: allergies, current medications, past family history, past medical history, past social history, past surgical history and problem list.  Past Medical History  Diagnosis Date  . Hepatitis C, chronic   . ADD (attention deficit disorder)   . Obesity   . Renal stone     Chronic, 15+ prior  . Blood transfusion    ROS as in subjective  Objective: Vital signs reviewed  General appearance: Alert, WD/WN, no distress, ill appearing                             Skin: warm, no rash, +diaphoresis                           Head: no sinus tenderness                            Eyes: conjunctiva normal, corneas clear, PERRLA                            Ears: left TM with mild erythema, right flat TM, external ear canals normal                          Nose: septum midline, turbinates swollen, with erythema and clear discharge             Mouth/throat: MMM, tongue normal, mild pharyngeal erythema                           Neck: supple, no adenopathy, no thyromegaly, nontender                          Heart: RRR, normal S1, S2, no murmurs                         Lungs: +decreased left lower field breath sounds, +few rhonchi, no  wheezes, no rales, decreased percussion and fremitus on the left                Extremities: no edema, nontender     Assessment and Plan: Encounter Diagnoses  Name Primary?  . Pneumonia Yes  . Cough   . Fever    Clinical I suspect pneumonia.   Will get stat CBC and CXR.  Rocephin 1gm IM given in office, Azithromycin script sent empirically for pneumonia.   advised rest, hydration, postpone the flight at  least several days if possible.   Discussed means of contagion, precautions, supportive care, and we will call with results and plan.

## 2012-12-20 ENCOUNTER — Telehealth: Payer: Self-pay | Admitting: Family Medicine

## 2012-12-20 MED ORDER — AMPHETAMINE-DEXTROAMPHETAMINE 20 MG PO TABS: 20.0000 mg | ORAL_TABLET | Freq: Two times a day (BID) | ORAL | Status: DC

## 2012-12-20 NOTE — Telephone Encounter (Signed)
Needs Rx Adderal, please call when ready

## 2012-12-20 NOTE — Telephone Encounter (Signed)
Adderall renewed. She will need an appointment before any more refills

## 2012-12-20 NOTE — Telephone Encounter (Signed)
PT INFORMED WORD FOR WORD AND PT VERBALIZED UNDERSTANDING 

## 2012-12-20 NOTE — Telephone Encounter (Signed)
Adderall renewed. She will need an appointment before further refills.

## 2013-01-09 ENCOUNTER — Ambulatory Visit (INDEPENDENT_AMBULATORY_CARE_PROVIDER_SITE_OTHER): Admitting: Family Medicine

## 2013-01-09 ENCOUNTER — Encounter: Payer: Self-pay | Admitting: Family Medicine

## 2013-01-09 VITALS — Wt 266.0 lb

## 2013-01-09 DIAGNOSIS — R6889 Other general symptoms and signs: Secondary | ICD-10-CM

## 2013-01-09 DIAGNOSIS — N6459 Other signs and symptoms in breast: Secondary | ICD-10-CM

## 2013-01-09 DIAGNOSIS — L259 Unspecified contact dermatitis, unspecified cause: Secondary | ICD-10-CM

## 2013-01-09 DIAGNOSIS — F988 Other specified behavioral and emotional disorders with onset usually occurring in childhood and adolescence: Secondary | ICD-10-CM

## 2013-01-09 MED ORDER — TRIAMCINOLONE ACETONIDE 0.5 % EX CREA: TOPICAL_CREAM | Freq: Three times a day (TID) | CUTANEOUS | Status: DC

## 2013-01-09 NOTE — Progress Notes (Signed)
  Subjective:    Patient ID: Barbara Cunningham, female    DOB: 20-Nov-1950, 62 y.o.   MRN: [id number]  HPI She is here for evaluation of a rash present on her right arm. She has been doing yard work and did note that this occurred approximately one week ago. She has had several smaller lesions occur since then. She also has underlying ADD and does occasionally use Adderall. She gets roughly 6 hours of benefit out of it and uses it usually when she has to do her books. Review of record indicates she did have an abnormal mammogram but has not followed up on this. She also has underlying hepatitis C.   Review of Systems     Objective:   Physical Exam Alert and in no distress. Exam of the right arm does show 2 large erythematous  Vesicular lesions with scattered satellite lesions.       Assessment & Plan:  Contact dermatitis  ADD (attention deficit disorder)  Abnormal breast finding I will give her Aristocort. Also recommend cool compresses. Discussed steroid pills but at this point think that would be overly aggressive. She will continue on her ADD medications that she has been doing. Discussed the breast findings with her. She plans to followup with this in October. I strongly encouraged her to definitely followup on the mammogram.

## 2013-05-29 ENCOUNTER — Telehealth: Payer: Self-pay | Admitting: Family Medicine

## 2013-05-29 MED ORDER — AMPHETAMINE-DEXTROAMPHETAMINE 20 MG PO TABS: 20.0000 mg | ORAL_TABLET | Freq: Two times a day (BID) | ORAL | Status: DC

## 2013-05-29 NOTE — Telephone Encounter (Signed)
Adderall renewed.

## 2013-05-29 NOTE — Telephone Encounter (Signed)
Pt needs refill on adderall call when ready °

## 2013-12-19 ENCOUNTER — Telehealth: Payer: Self-pay | Admitting: Internal Medicine

## 2013-12-19 MED ORDER — AMPHETAMINE-DEXTROAMPHETAMINE 20 MG PO TABS: 20.0000 mg | ORAL_TABLET | Freq: Two times a day (BID) | ORAL | Status: DC

## 2013-12-19 NOTE — Telephone Encounter (Signed)
Pt needs a refill on her adderall. When pt comes to pick up rx she will make an appt then

## 2013-12-20 ENCOUNTER — Telehealth: Payer: Self-pay | Admitting: Family Medicine

## 2013-12-20 NOTE — Telephone Encounter (Signed)
Pt informed rx is ready.  

## 2014-03-12 ENCOUNTER — Ambulatory Visit (INDEPENDENT_AMBULATORY_CARE_PROVIDER_SITE_OTHER): Admitting: Family Medicine

## 2014-03-12 ENCOUNTER — Encounter: Payer: Self-pay | Admitting: Family Medicine

## 2014-03-12 VITALS — BP 130/90 | HR 74 | Wt 279.0 lb

## 2014-03-12 DIAGNOSIS — N390 Urinary tract infection, site not specified: Secondary | ICD-10-CM

## 2014-03-12 DIAGNOSIS — F988 Other specified behavioral and emotional disorders with onset usually occurring in childhood and adolescence: Secondary | ICD-10-CM

## 2014-03-12 DIAGNOSIS — R319 Hematuria, unspecified: Secondary | ICD-10-CM

## 2014-03-12 LAB — POCT URINALYSIS DIPSTICK
BILIRUBIN UA: NEGATIVE
GLUCOSE UA: NEGATIVE
Ketones, UA: NEGATIVE
NITRITE UA: NEGATIVE
PH UA: 5
Spec Grav, UA: 1.025
UROBILINOGEN UA: NEGATIVE

## 2014-03-12 MED ORDER — AMPHETAMINE-DEXTROAMPHETAMINE 20 MG PO TABS: 20.0000 mg | ORAL_TABLET | Freq: Two times a day (BID) | ORAL | Status: DC

## 2014-03-12 MED ORDER — SULFAMETHOXAZOLE-TMP DS 800-160 MG PO TABS: 1.0000 | ORAL_TABLET | Freq: Two times a day (BID) | ORAL | Status: DC

## 2014-03-12 NOTE — Progress Notes (Signed)
   Subjective:    Patient ID: Barbara Cunningham, female    DOB: 04-Feb-1951, 63 y.o.   MRN: [id number]  HPI She has a one-day history of left CVA pain that she describes as excruciating. She has a previous history of difficulty with this being related to UTI. She apparently has had previous episodes of kidney stones but states his pain is not the same. She also has underlying ADD. She takes Adderall twice per day on an as-needed basis. Also time 8 hours of benefit is all she needs.She has no withdrawal symptoms.   Review of Systems     Objective:   Physical Exam Alert and in no distress. Slight left CVA tenderness but no palpable lesions. Abdominal exam shows no masses or tenderness.  Urine microscopic examination was contaminated with epithelial cells.    Assessment & Plan:  Hematuria - Plan: POCT Urinalysis Dipstick  ADD (attention deficit disorder) - Plan: amphetamine-dextroamphetamine (ADDERALL) 20 MG tablet  UTI (lower urinary tract infection) - Plan: CULTURE, URINE COMPREHENSIVE, sulfamethoxazole-trimethoprim (BACTRIM DS) 800-160 MG per tablet  she uses her Adderall more on an as-needed basis and rarely twice per day. I discussed the fact that her urine microscopic was really noncontributory. I will place her on an antibiotic until culture results.

## 2014-03-15 LAB — CULTURE, URINE COMPREHENSIVE: Colony Count: >=100000

## 2014-03-15 NOTE — Progress Notes (Signed)
Pt called and I advised of lab results.

## 2015-01-03 ENCOUNTER — Telehealth: Payer: Self-pay | Admitting: Family Medicine

## 2015-01-03 DIAGNOSIS — F988 Other specified behavioral and emotional disorders with onset usually occurring in childhood and adolescence: Secondary | ICD-10-CM

## 2015-01-03 MED ORDER — AMPHETAMINE-DEXTROAMPHETAMINE 20 MG PO TABS: 20.0000 mg | ORAL_TABLET | Freq: Two times a day (BID) | ORAL | Status: DC

## 2015-01-03 NOTE — Telephone Encounter (Signed)
Pt informed rx is ready.  

## 2015-01-03 NOTE — Telephone Encounter (Signed)
Pt called and left voice message she needs refill on adderall

## 2015-02-01 ENCOUNTER — Encounter: Payer: Self-pay | Admitting: Family Medicine

## 2015-02-01 ENCOUNTER — Ambulatory Visit (INDEPENDENT_AMBULATORY_CARE_PROVIDER_SITE_OTHER): Payer: Medicare Other | Admitting: Family Medicine

## 2015-02-01 VITALS — BP 124/80 | HR 60 | Temp 97.5°F | Wt 274.0 lb

## 2015-02-01 DIAGNOSIS — R928 Other abnormal and inconclusive findings on diagnostic imaging of breast: Secondary | ICD-10-CM | POA: Diagnosis not present

## 2015-02-01 DIAGNOSIS — B1920 Unspecified viral hepatitis C without hepatic coma: Secondary | ICD-10-CM

## 2015-02-01 DIAGNOSIS — N6459 Other signs and symptoms in breast: Secondary | ICD-10-CM

## 2015-02-01 DIAGNOSIS — F909 Attention-deficit hyperactivity disorder, unspecified type: Secondary | ICD-10-CM | POA: Diagnosis not present

## 2015-02-01 DIAGNOSIS — N3000 Acute cystitis without hematuria: Secondary | ICD-10-CM

## 2015-02-01 DIAGNOSIS — F988 Other specified behavioral and emotional disorders with onset usually occurring in childhood and adolescence: Secondary | ICD-10-CM

## 2015-02-01 DIAGNOSIS — R6889 Other general symptoms and signs: Secondary | ICD-10-CM

## 2015-02-01 LAB — POCT URINALYSIS DIPSTICK
Glucose, UA: NEGATIVE
Ketones, UA: NEGATIVE
NITRITE UA: POSITIVE
PH UA: 6
PROTEIN UA: NEGATIVE
RBC UA: NEGATIVE
Spec Grav, UA: >=1.03
Urobilinogen, UA: NEGATIVE

## 2015-02-01 MED ORDER — SULFAMETHOXAZOLE-TRIMETHOPRIM 800-160 MG PO TABS: 1.0000 | ORAL_TABLET | Freq: Two times a day (BID) | ORAL | Status: DC

## 2015-02-01 MED ORDER — HYDROCODONE-ACETAMINOPHEN 5-325 MG PO TABS
1.0000 | ORAL_TABLET | Freq: Four times a day (QID) | ORAL | Status: DC | PRN
Start: 2015-02-01 — End: 2015-03-26

## 2015-02-01 NOTE — Progress Notes (Signed)
   Subjective:    Patient ID: Barbara Cunningham, female    DOB: 07-19-1951, 64 y.o.   MRN: [id number]  HPI She has a one-day history of left CVA pain but no frequency, urgency, dysuria. This symptom is similar to previous episodes of UTI/kidney stone.She thinks however this is a UTI. She also has an underlying history of ADD and does intermittently use her medication. It lasts 5-6 hours and she has no difficulty when she comes off the medication. Review of the record indicates she does need a follow-up mammogram. Apparently she did have an abnormal 1 but did not follow-up as directed. 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   Review of Systems     Objective:   Physical Exam Alert and in moderate distress. No tenderness to palpation in the left CVA or abdominal area. Urine dipstick was positive.       Assessment & Plan:  Acute cystitis without hematuria - Plan: sulfamethoxazole-trimethoprim (BACTRIM DS,SEPTRA DS) 800-160 MG per tablet, HYDROcodone-acetaminophen (NORCO) 5-325 MG per tablet, POCT Urinalysis Dipstick  Hepatitis C virus infection, unspecified chronicity - Plan: Hepatitis C RNA quantitative  ADD (attention deficit disorder)  Abnormal mammogram - Plan: MM Digital Diagnostic Bilat  Abnormal breast finding I will treat her as if she has a UTI and give pain medication. She will call if she gets worse. We'll also follow-up on hepatitis C. We will also set her up to do the mammogram.

## 2015-02-01 NOTE — Patient Instructions (Signed)
Drink plenty of fluids and use the Azo-Standard first but if it doesn't work then use the codeine. 10 days worth of the antibody take it all. If any problems call me

## 2015-02-04 LAB — HEPATITIS C RNA QUANTITATIVE
HCV Quantitative Log: 6.43 {Log} — ABNORMAL HIGH (ref <1.18)
HCV Quantitative: 2678564 IU/mL — ABNORMAL HIGH (ref <15)

## 2015-02-06 ENCOUNTER — Telehealth: Payer: Self-pay

## 2015-02-06 ENCOUNTER — Other Ambulatory Visit: Payer: Medicare Other

## 2015-02-06 DIAGNOSIS — N3000 Acute cystitis without hematuria: Secondary | ICD-10-CM | POA: Diagnosis not present

## 2015-02-06 DIAGNOSIS — N3001 Acute cystitis with hematuria: Secondary | ICD-10-CM | POA: Diagnosis not present

## 2015-02-06 NOTE — Telephone Encounter (Signed)
Patient said she talked to you about her UTI she wanted to know if UA was sent off for culture she said she doesn't feel much better and asked if she could come by and drop off another urine to have cultured she not sure this med is taking care of it please advise

## 2015-02-06 NOTE — Telephone Encounter (Signed)
Have her bring a specimen by and we will get a culture

## 2015-02-06 NOTE — Telephone Encounter (Signed)
Left message to come in after 1;30

## 2015-02-08 LAB — URINE CULTURE: Colony Count: 3000

## 2015-03-25 DIAGNOSIS — B182 Chronic viral hepatitis C: Secondary | ICD-10-CM | POA: Diagnosis not present

## 2015-03-26 ENCOUNTER — Other Ambulatory Visit (HOSPITAL_COMMUNITY): Payer: Self-pay | Admitting: Nurse Practitioner

## 2015-03-26 ENCOUNTER — Ambulatory Visit (INDEPENDENT_AMBULATORY_CARE_PROVIDER_SITE_OTHER): Payer: Medicare Other | Admitting: Family Medicine

## 2015-03-26 ENCOUNTER — Encounter: Payer: Self-pay | Admitting: Family Medicine

## 2015-03-26 VITALS — BP 130/80 | Wt 273.0 lb

## 2015-03-26 DIAGNOSIS — B182 Chronic viral hepatitis C: Secondary | ICD-10-CM

## 2015-03-26 DIAGNOSIS — L259 Unspecified contact dermatitis, unspecified cause: Secondary | ICD-10-CM | POA: Diagnosis not present

## 2015-03-26 MED ORDER — TRIAMCINOLONE ACETONIDE 0.5 % EX OINT: 1.0000 "application " | TOPICAL_OINTMENT | Freq: Two times a day (BID) | CUTANEOUS | Status: DC

## 2015-03-26 MED ORDER — HYDROXYZINE HCL 10 MG PO TABS: 10.0000 mg | ORAL_TABLET | Freq: Three times a day (TID) | ORAL | Status: DC | PRN

## 2015-03-26 NOTE — Progress Notes (Signed)
   Subjective:    Patient ID: Barbara Cunningham, female    DOB: Jun 08, 1951, 64 y.o.   MRN: [id number]  HPI She notes pruritic lesions on her arms, between her fingers, on her face and anterior chest. She is concerned about poison ivy and whether it is spreading.   Review of Systems     Objective:   Physical Exam Scattered erythematous macular lesions are noted on the hands, arms, face and anterior chest.       Assessment & Plan:  Contact dermatitis - Plan: triamcinolone ointment (KENALOG) 0.5 %, hydrOXYzine (ATARAX/VISTARIL) 10 MG tablet This almost looks like she had a weedeater send out small specks of the offending plant. She says that this did not occur. I will treat with topical as well as Atarax. He will call if this gets worse. Explained that steroid would not be appropriate except topically.

## 2015-04-15 DIAGNOSIS — B182 Chronic viral hepatitis C: Secondary | ICD-10-CM | POA: Diagnosis not present

## 2015-04-23 ENCOUNTER — Ambulatory Visit (HOSPITAL_COMMUNITY)
Admission: RE | Admit: 2015-04-23 | Discharge: 2015-04-23 | Disposition: A | Discharge status: Home / Self Care | Payer: Medicare Other | Source: Ambulatory Visit | Attending: Nurse Practitioner | Admitting: Nurse Practitioner

## 2015-04-23 DIAGNOSIS — B182 Chronic viral hepatitis C: Secondary | ICD-10-CM | POA: Diagnosis not present

## 2015-04-23 DIAGNOSIS — D1771 Benign lipomatous neoplasm of kidney: Secondary | ICD-10-CM | POA: Diagnosis not present

## 2015-04-23 DIAGNOSIS — K8020 Calculus of gallbladder without cholecystitis without obstruction: Secondary | ICD-10-CM | POA: Insufficient documentation

## 2015-05-01 DIAGNOSIS — H02834 Dermatochalasis of left upper eyelid: Secondary | ICD-10-CM | POA: Diagnosis not present

## 2015-05-01 DIAGNOSIS — H2513 Age-related nuclear cataract, bilateral: Secondary | ICD-10-CM | POA: Diagnosis not present

## 2015-05-01 DIAGNOSIS — H04123 Dry eye syndrome of bilateral lacrimal glands: Secondary | ICD-10-CM | POA: Diagnosis not present

## 2015-05-01 DIAGNOSIS — H02831 Dermatochalasis of right upper eyelid: Secondary | ICD-10-CM | POA: Diagnosis not present

## 2015-05-07 DIAGNOSIS — K740 Hepatic fibrosis: Secondary | ICD-10-CM | POA: Diagnosis not present

## 2015-05-07 DIAGNOSIS — B182 Chronic viral hepatitis C: Secondary | ICD-10-CM | POA: Diagnosis not present

## 2015-06-11 DIAGNOSIS — B182 Chronic viral hepatitis C: Secondary | ICD-10-CM | POA: Diagnosis not present

## 2015-06-18 DIAGNOSIS — K740 Hepatic fibrosis: Secondary | ICD-10-CM | POA: Diagnosis not present

## 2015-06-18 DIAGNOSIS — B182 Chronic viral hepatitis C: Secondary | ICD-10-CM | POA: Diagnosis not present

## 2015-08-02 ENCOUNTER — Telehealth: Payer: Self-pay | Admitting: Family Medicine

## 2015-08-02 DIAGNOSIS — F988 Other specified behavioral and emotional disorders with onset usually occurring in childhood and adolescence: Secondary | ICD-10-CM

## 2015-08-02 MED ORDER — AMPHETAMINE-DEXTROAMPHETAMINE 20 MG PO TABS: 20.0000 mg | ORAL_TABLET | Freq: Two times a day (BID) | ORAL | Status: DC

## 2015-08-02 NOTE — Telephone Encounter (Signed)
Pt called requesting a refill on her adderrall pt is coming to get her son rx this afternoon and was wanting to know if you go write hers to so she can get both of them at the same time.

## 2015-08-08 DIAGNOSIS — B182 Chronic viral hepatitis C: Secondary | ICD-10-CM | POA: Diagnosis not present

## 2015-08-13 DIAGNOSIS — K740 Hepatic fibrosis: Secondary | ICD-10-CM | POA: Diagnosis not present

## 2015-08-13 DIAGNOSIS — B182 Chronic viral hepatitis C: Secondary | ICD-10-CM | POA: Diagnosis not present

## 2015-09-02 ENCOUNTER — Telehealth: Payer: Self-pay | Admitting: Family Medicine

## 2015-09-02 NOTE — Telephone Encounter (Signed)
Have her come by and drop off a urine specimen

## 2015-09-02 NOTE — Telephone Encounter (Signed)
Pt called and stated that she is having symptoms of a uti. She states that she should have standing orders in her chart so she doesn't have to be seen. She states she can just come in and give a urine sample. I dont see anything in her chart. Please advise. Pt can be reached at [phone].

## 2015-09-03 ENCOUNTER — Other Ambulatory Visit (INDEPENDENT_AMBULATORY_CARE_PROVIDER_SITE_OTHER): Payer: Medicare Other

## 2015-09-03 DIAGNOSIS — N390 Urinary tract infection, site not specified: Secondary | ICD-10-CM

## 2015-09-03 NOTE — Telephone Encounter (Signed)
Pt informed

## 2015-09-04 ENCOUNTER — Other Ambulatory Visit: Payer: Self-pay | Admitting: Family Medicine

## 2015-09-04 DIAGNOSIS — N390 Urinary tract infection, site not specified: Secondary | ICD-10-CM

## 2015-09-04 LAB — POCT URINALYSIS DIPSTICK
Bilirubin, UA: NEGATIVE
Glucose, UA: NEGATIVE
Ketones, UA: NEGATIVE
NITRITE UA: NEGATIVE
PH UA: 6
Protein, UA: NEGATIVE
RBC UA: NEGATIVE
Spec Grav, UA: >=1.03
UROBILINOGEN UA: NEGATIVE

## 2015-09-04 MED ORDER — CIPROFLOXACIN HCL 500 MG PO TABS: 500.0000 mg | ORAL_TABLET | Freq: Two times a day (BID) | ORAL | Status: DC

## 2015-09-04 NOTE — Progress Notes (Signed)
She is having intermittent lower back pain similar to previous episodes of UTI. She also apparently has had kidney stones as well but does not think it is at the same level. I will call in Cipro since she does have a positive UA. She will call if continued difficulty.

## 2015-09-23 ENCOUNTER — Telehealth: Payer: Self-pay | Admitting: Family Medicine

## 2015-09-23 DIAGNOSIS — N390 Urinary tract infection, site not specified: Secondary | ICD-10-CM

## 2015-09-23 MED ORDER — CIPROFLOXACIN HCL 500 MG PO TABS: 500.0000 mg | ORAL_TABLET | Freq: Two times a day (BID) | ORAL | Status: DC

## 2015-09-23 NOTE — Telephone Encounter (Signed)
Let her know that I called in the full 10 day course.

## 2015-09-23 NOTE — Telephone Encounter (Signed)
She was treated for UTI a few weeks ago. She took short course of Cipro.  She seemed to get better, pain went away for a week or so but has now  came back. Would like to get different Rx called in  Please call patient and advise if RX called/sent in      [place]

## 2015-09-23 NOTE — Telephone Encounter (Signed)
Pt called and I advised of rx sent in

## 2015-10-14 ENCOUNTER — Telehealth: Payer: Self-pay | Admitting: Family Medicine

## 2015-10-14 ENCOUNTER — Other Ambulatory Visit (INDEPENDENT_AMBULATORY_CARE_PROVIDER_SITE_OTHER): Payer: Medicare Other

## 2015-10-14 DIAGNOSIS — R8290 Unspecified abnormal findings in urine: Secondary | ICD-10-CM

## 2015-10-14 DIAGNOSIS — R8299 Other abnormal findings in urine: Secondary | ICD-10-CM | POA: Diagnosis not present

## 2015-10-14 LAB — POCT URINALYSIS DIPSTICK
Bilirubin, UA: NEGATIVE
Blood, UA: NEGATIVE
Glucose, UA: NEGATIVE
KETONES UA: NEGATIVE
Nitrite, UA: NEGATIVE
PH UA: 6
Spec Grav, UA: 1.025
Urobilinogen, UA: NEGATIVE

## 2015-10-14 NOTE — Telephone Encounter (Signed)
Pt states that kidney-urinary issues still has not resolves after taking med. Does Dr Redmond School want her to come in for UA only again or does she need an appointment this time? Pt said she is having the same pain as normal.

## 2015-10-14 NOTE — Telephone Encounter (Signed)
She will stop by

## 2015-10-14 NOTE — Telephone Encounter (Signed)
Have her drop off a urine specimen

## 2015-10-14 NOTE — Telephone Encounter (Signed)
Please advise 

## 2015-10-15 LAB — URINE CULTURE
Colony Count: NO GROWTH
Organism ID, Bacteria: NO GROWTH

## 2015-10-17 ENCOUNTER — Telehealth: Payer: Self-pay | Admitting: Family Medicine

## 2015-10-17 NOTE — Telephone Encounter (Signed)
Pt called and stated she received your message. She states that her symptoms are stabbing pain on her left side. She states that it is the same symptoms that she gets when she either has a kidney stone or infection. She states that since her urine showed no sign of infection that she thinks she may need a scan. Pt can be reached at [phone].

## 2015-10-17 NOTE — Telephone Encounter (Signed)
She needs to be evaluated. She had no blood in her urine which makes a stone less likely but not eliminates it. Schedule her with me if she can wait till Monday. If not have Jocelyn Lamer see her tomorrow

## 2015-10-18 NOTE — Telephone Encounter (Signed)
On schedule for monday

## 2015-10-21 ENCOUNTER — Telehealth: Payer: Self-pay | Admitting: Family Medicine

## 2015-10-21 ENCOUNTER — Ambulatory Visit: Payer: Self-pay | Admitting: Family Medicine

## 2015-10-21 MED ORDER — OSELTAMIVIR PHOSPHATE 75 MG PO CAPS: 75.0000 mg | ORAL_CAPSULE | Freq: Every day | ORAL | Status: DC

## 2015-10-21 NOTE — Telephone Encounter (Signed)
Let her know that I called it in 

## 2015-10-21 NOTE — Telephone Encounter (Signed)
Pt has been taking care of her granddaughter all weekend who was diagnosed with the flu over the weekend. Can she get a RX foe Tamiflu and send to Kristopher Oppenheim at [facility] since [facility] location is out of Tamiflu

## 2015-10-21 NOTE — Telephone Encounter (Signed)
Patient informed tamiflu was sent to pharmacy. 

## 2015-10-28 ENCOUNTER — Ambulatory Visit
Admission: RE | Admit: 2015-10-28 | Discharge: 2015-10-28 | Disposition: A | Discharge status: Home / Self Care | Payer: Medicare Other | Source: Ambulatory Visit | Attending: Family Medicine | Admitting: Family Medicine

## 2015-10-28 ENCOUNTER — Encounter: Payer: Self-pay | Admitting: Family Medicine

## 2015-10-28 ENCOUNTER — Ambulatory Visit (INDEPENDENT_AMBULATORY_CARE_PROVIDER_SITE_OTHER): Payer: Medicare Other | Admitting: Family Medicine

## 2015-10-28 VITALS — BP 120/72 | HR 64 | Wt 265.6 lb

## 2015-10-28 DIAGNOSIS — R109 Unspecified abdominal pain: Secondary | ICD-10-CM

## 2015-10-28 DIAGNOSIS — Z87442 Personal history of urinary calculi: Secondary | ICD-10-CM | POA: Diagnosis not present

## 2015-10-28 LAB — COMPREHENSIVE METABOLIC PANEL
ALK PHOS: 74 U/L (ref 33–130)
ALT: 17 U/L (ref 6–29)
AST: 20 U/L (ref 10–35)
Albumin: 3.9 g/dL (ref 3.6–5.1)
BUN: 14 mg/dL (ref 7–25)
CHLORIDE: 109 mmol/L (ref 98–110)
CO2: 23 mmol/L (ref 20–31)
Calcium: 9.1 mg/dL (ref 8.6–10.4)
Creat: 0.64 mg/dL (ref 0.50–0.99)
GLUCOSE: 89 mg/dL (ref 65–99)
POTASSIUM: 4.2 mmol/L (ref 3.5–5.3)
Sodium: 140 mmol/L (ref 135–146)
Total Bilirubin: 0.5 mg/dL (ref 0.2–1.2)
Total Protein: 6.7 g/dL (ref 6.1–8.1)

## 2015-10-28 LAB — CBC WITH DIFFERENTIAL/PLATELET
BASOS PCT: 0 %
Basophils Absolute: 0 cells/uL (ref 0–200)
Eosinophils Absolute: 120 cells/uL (ref 15–500)
Eosinophils Relative: 2 %
HCT: 44.3 % (ref 35.0–45.0)
Hemoglobin: 14.8 g/dL (ref 11.7–15.5)
LYMPHS ABS: 2880 {cells}/uL (ref 850–3900)
LYMPHS PCT: 48 %
MCH: 29 pg (ref 27.0–33.0)
MCHC: 33.4 g/dL (ref 32.0–36.0)
MCV: 86.9 fL (ref 80.0–100.0)
MPV: 10.7 fL (ref 7.5–12.5)
Monocytes Absolute: 420 cells/uL (ref 200–950)
Monocytes Relative: 7 %
NEUTROS PCT: 43 %
Neutro Abs: 2580 cells/uL (ref 1500–7800)
PLATELETS: 205 10*3/uL (ref 140–400)
RBC: 5.1 MIL/uL (ref 3.80–5.10)
RDW: 13.6 % (ref 11.0–15.0)
WBC: 6 10*3/uL (ref 4.0–10.5)

## 2015-10-28 NOTE — Progress Notes (Signed)
   Subjective:    Patient ID: Barbara Cunningham, female    DOB: 04-Sep-1950, 65 y.o.   MRN: [id number]  HPI He is here for consultation concerning continued difficulty with intermittent left flank pain. She has a previous history of renal stones and subsequent surgery several years ago. Since then she has continued to have intermittent pain. Previous urinalysis report showed occasional positive results for UTI and subsequent treatment however she continues to have difficulty. As stated before her pain is intermittent in nature. There is no change with position, coughing, eating, bowel habits or urinary symptoms.She also has a history of hepatitis C and was recently treated and now her mostly recent viral load is undetectable.   Review of Systems     Objective:   Physical Exam Alert and in no distress. No tenderness palpation in the left CVA. Abdominal exam shows no masses or tenderness with decreased bowel sounds.       Assessment & Plan:  History of renal stone - Plan: CBC with Differential/Platelet, Comprehensive metabolic panel, CT Abdomen Pelvis Wo Contrast  Left flank pain - Plan: CBC with Differential/Platelet, Comprehensive metabolic panel, CT Abdomen Pelvis Wo Contrast I will start initially with CT of abdomen and pelvis to see if there is any renal involvement. May need to do further urologic evaluation. Also of note is she will need a follow-up colonoscopy but will pursue that after we've more thoroughly evaluated the CVA issue.

## 2015-10-29 DIAGNOSIS — B182 Chronic viral hepatitis C: Secondary | ICD-10-CM | POA: Diagnosis not present

## 2015-11-01 ENCOUNTER — Telehealth: Payer: Self-pay | Admitting: Internal Medicine

## 2015-11-01 DIAGNOSIS — R109 Unspecified abdominal pain: Secondary | ICD-10-CM

## 2015-11-01 DIAGNOSIS — Z87442 Personal history of urinary calculi: Secondary | ICD-10-CM

## 2015-11-01 NOTE — Telephone Encounter (Signed)
Faxed over referral to [facility] at Dr. Benson Norway and she will have Dr. Benson Norway look at it to see if he can take on pt

## 2015-11-18 DIAGNOSIS — Z1211 Encounter for screening for malignant neoplasm of colon: Secondary | ICD-10-CM | POA: Diagnosis not present

## 2015-11-18 DIAGNOSIS — R933 Abnormal findings on diagnostic imaging of other parts of digestive tract: Secondary | ICD-10-CM | POA: Diagnosis not present

## 2015-11-18 DIAGNOSIS — M545 Low back pain: Secondary | ICD-10-CM | POA: Diagnosis not present

## 2015-12-16 DIAGNOSIS — M545 Low back pain: Secondary | ICD-10-CM | POA: Diagnosis not present

## 2015-12-16 DIAGNOSIS — R933 Abnormal findings on diagnostic imaging of other parts of digestive tract: Secondary | ICD-10-CM | POA: Diagnosis not present

## 2016-01-22 DIAGNOSIS — B182 Chronic viral hepatitis C: Secondary | ICD-10-CM | POA: Diagnosis not present

## 2016-02-03 DIAGNOSIS — Z8619 Personal history of other infectious and parasitic diseases: Secondary | ICD-10-CM | POA: Diagnosis not present

## 2016-02-03 DIAGNOSIS — K740 Hepatic fibrosis: Secondary | ICD-10-CM | POA: Diagnosis not present

## 2016-02-04 ENCOUNTER — Other Ambulatory Visit: Payer: Self-pay | Admitting: Nurse Practitioner

## 2016-02-04 DIAGNOSIS — K7469 Other cirrhosis of liver: Secondary | ICD-10-CM

## 2016-02-11 ENCOUNTER — Ambulatory Visit
Admission: RE | Admit: 2016-02-11 | Discharge: 2016-02-11 | Disposition: A | Discharge status: Home / Self Care | Payer: Medicare Other | Source: Ambulatory Visit | Attending: Nurse Practitioner | Admitting: Nurse Practitioner

## 2016-02-11 DIAGNOSIS — K8020 Calculus of gallbladder without cholecystitis without obstruction: Secondary | ICD-10-CM | POA: Diagnosis not present

## 2016-02-11 DIAGNOSIS — K7469 Other cirrhosis of liver: Secondary | ICD-10-CM

## 2016-04-07 ENCOUNTER — Telehealth: Payer: Self-pay | Admitting: Family Medicine

## 2016-04-07 ENCOUNTER — Other Ambulatory Visit (INDEPENDENT_AMBULATORY_CARE_PROVIDER_SITE_OTHER): Payer: Medicare Other

## 2016-04-07 DIAGNOSIS — R109 Unspecified abdominal pain: Secondary | ICD-10-CM

## 2016-04-07 LAB — POCT URINALYSIS DIPSTICK
Bilirubin, UA: NEGATIVE
Glucose, UA: NEGATIVE
KETONES UA: NEGATIVE
Leukocytes, UA: NEGATIVE
Nitrite, UA: NEGATIVE
PROTEIN UA: NEGATIVE
RBC UA: NEGATIVE
UROBILINOGEN UA: NEGATIVE
pH, UA: 6

## 2016-04-07 NOTE — Telephone Encounter (Signed)
Urine was neg. Dr.Lalonde said for me to inform her and to suggest that she could use AZO standard for a couple of days Pt informed and verbalized understanding

## 2016-04-07 NOTE — Telephone Encounter (Signed)
Have her come by

## 2016-04-07 NOTE — Telephone Encounter (Signed)
She is on her on way

## 2016-04-07 NOTE — Telephone Encounter (Signed)
Pt requesting to come in for a lab only visit today if possible to leave a urine sample. She said she is having her normal urinary symptoms where she feels as if she may have a kidney infections and bladder feels like it may explode. Is this ok?

## 2016-05-05 ENCOUNTER — Emergency Department (HOSPITAL_COMMUNITY): Payer: Medicare Other

## 2016-05-05 ENCOUNTER — Emergency Department (HOSPITAL_COMMUNITY)
Admission: EM | Admit: 2016-05-05 | Discharge: 2016-05-05 | Disposition: A | Discharge status: Home / Self Care | Payer: Medicare Other | Attending: Emergency Medicine | Admitting: Emergency Medicine

## 2016-05-05 ENCOUNTER — Encounter (HOSPITAL_COMMUNITY): Payer: Self-pay | Admitting: Emergency Medicine

## 2016-05-05 DIAGNOSIS — S52571A Other intraarticular fracture of lower end of right radius, initial encounter for closed fracture: Secondary | ICD-10-CM | POA: Insufficient documentation

## 2016-05-05 DIAGNOSIS — Y999 Unspecified external cause status: Secondary | ICD-10-CM | POA: Diagnosis not present

## 2016-05-05 DIAGNOSIS — Y9301 Activity, walking, marching and hiking: Secondary | ICD-10-CM | POA: Insufficient documentation

## 2016-05-05 DIAGNOSIS — S6991XA Unspecified injury of right wrist, hand and finger(s), initial encounter: Secondary | ICD-10-CM | POA: Diagnosis present

## 2016-05-05 DIAGNOSIS — Z79899 Other long term (current) drug therapy: Secondary | ICD-10-CM | POA: Diagnosis not present

## 2016-05-05 DIAGNOSIS — Z87891 Personal history of nicotine dependence: Secondary | ICD-10-CM | POA: Insufficient documentation

## 2016-05-05 DIAGNOSIS — S62101A Fracture of unspecified carpal bone, right wrist, initial encounter for closed fracture: Secondary | ICD-10-CM

## 2016-05-05 DIAGNOSIS — Y929 Unspecified place or not applicable: Secondary | ICD-10-CM | POA: Diagnosis not present

## 2016-05-05 DIAGNOSIS — W108XXA Fall (on) (from) other stairs and steps, initial encounter: Secondary | ICD-10-CM | POA: Insufficient documentation

## 2016-05-05 MED ORDER — OXYCODONE-ACETAMINOPHEN 5-325 MG PO TABS
1.0000 | ORAL_TABLET | Freq: Once | ORAL | Status: AC
  Administered 2016-05-05: 1 via ORAL
  Filled 2016-05-05: qty 1

## 2016-05-05 MED ORDER — IBUPROFEN 400 MG PO TABS
600.0000 mg | ORAL_TABLET | Freq: Once | ORAL | Status: AC
  Administered 2016-05-05: 600 mg via ORAL
  Filled 2016-05-05: qty 1

## 2016-05-05 MED ORDER — OXYCODONE-ACETAMINOPHEN 5-325 MG PO TABS
2.0000 | ORAL_TABLET | ORAL | 0 refills | Status: DC | PRN
Start: 2016-05-05 — End: 2016-07-13

## 2016-05-05 MED ORDER — DICLOFENAC SODIUM 50 MG PO TBEC: 50.0000 mg | DELAYED_RELEASE_TABLET | Freq: Two times a day (BID) | ORAL | 0 refills | Status: DC

## 2016-05-05 NOTE — Progress Notes (Signed)
Orthopedic Tech Progress Note Patient Details:  Barbara Cunningham [birth year] [id number]  Ortho Devices Type of Ortho Device: Ace wrap, Sugartong splint, Arm sling Ortho Device/Splint Location: RUE Ortho Device/Splint Interventions: Ordered, Application   Braulio Bosch 05/05/2016, 9:27 PM

## 2016-05-05 NOTE — ED Provider Notes (Signed)
Medical screening examination/treatment/procedure(s) were conducted as a shared visit with non-physician practitioner(s) and myself.  I personally evaluated the patient during the encounter.   EKG Interpretation None      Results for orders placed or performed in visit on 04/07/16  POCT Urinalysis Dipstick  Result Value Ref Range   Color, UA yellow    Clarity, UA clear    Glucose, UA n    Bilirubin, UA n    Ketones, UA n    Spec Grav, UA >=1.030    Blood, UA n    pH, UA 6.0    Protein, UA n    Urobilinogen, UA negative    Nitrite, UA n    Leukocytes, UA Negative Negative   Dg Wrist Complete Right  Result Date: 05/05/2016 CLINICAL DATA:  Right wrist injury EXAM: RIGHT WRIST - COMPLETE 3+ VIEW COMPARISON:  None. FINDINGS: There is a comminuted intra-articular right distal radius fracture with mild impaction of the fracture fragments and 6 mm dorsal displacement of the dominant distal fracture fragment. No additional fracture. No dislocation. Diffuse right wrist soft tissue swelling. No suspicious focal osseous lesion. No appreciable degenerative or erosive arthropathy. No radiopaque foreign body. IMPRESSION: Comminuted, impacted and mildly displaced intra-articular right distal radius fracture as described. Electronically Signed   By: Ilona Sorrel M.D.   On: 05/05/2016 20:10   Patient status post fall with isolated injury to the right wrist distal forearm. Patient with good cap refill swelling there. X-ray show evidence of a mildly displaced intra-articular right distal radius fracture with comminuted impacted components. Patient sensations intact Refill is less than 2 seconds. Will put in a sugar tong splint and referred to hand surgery for follow-up. Patient without any other injuries. Patient fell did not have loss of consciousness patient fell down steps.   Fredia Sorrow, MD 05/05/16 2100

## 2016-05-05 NOTE — ED Provider Notes (Signed)
[facility] DEPT Provider Note   CSN: [id number] Arrival date & time: 05/05/16  [id number]  By signing my name below, I, Ephriam Jenkins, attest that this documentation has been prepared under the direction and in the presence of [facility] NP.  Electronically Signed: Ephriam Jenkins, ED Scribe. 05/05/16. 8:12 PM.  History   Chief Complaint Chief Complaint  Patient presents with  . Wrist Pain    HPI HPI Comments: Barbara Cunningham is a 65 y.o. female who presents to the Emergency Department complaining of sudden onset right wrist pain s/p an incident that occurred four hours ago. Pt was walking down wooden steps when she slipped and fell down 7 of the steps. Pt states she landed on the wrist and reports pain and swelling to the right wrist since. She denies any head injury or LOC. Pt was able to ambulate without difficulty s/p. She has applied ice to the area with no relief of swelling. Sensation intact.   The history is provided by the patient. No language interpreter was used.    Past Medical History:  Diagnosis Date  . ADD (attention deficit disorder)   . Blood transfusion   . Hepatitis C, chronic ([place])   . Obesity   . Renal stone    Chronic, 15+ prior    Patient Active Problem List   Diagnosis Date Noted  . ADD (attention deficit disorder) 08/24/2011  . Hepatitis C 08/24/2011  . Abnormal breast finding 08/24/2011  . Counseling on health promotion and disease prevention 08/24/2011    Past Surgical History:  Procedure Laterality Date  . [place]  . renal stone surgery  1981  . RHINOPLASTY      OB History    No data available       Home Medications    Prior to Admission medications   Medication Sig Start Date End Date Taking? Authorizing Provider  amphetamine-dextroamphetamine (ADDERALL) 20 MG tablet Take 1 tablet (20 mg total) by mouth 2 (two) times daily. 08/02/15   Denita Lung, MD  diclofenac (VOLTAREN) 50 MG EC tablet Take 1 tablet (50 mg  total) by mouth 2 (two) times daily. 05/05/16   Hope Bunnie Pion, NP  oxyCODONE-acetaminophen (PERCOCET/ROXICET) 5-325 MG tablet Take 2 tablets by mouth every 4 (four) hours as needed for severe pain. 05/05/16   Hope Bunnie Pion, NP    Family History Family History  Problem Relation Age of Onset  . Vision loss Father     Social History Social History  Substance Use Topics  . Smoking status: Former Research scientist (life sciences)  . Smokeless tobacco: Never Used     Comment: quit 2008  . Alcohol use No     Allergies   Prednisone   Review of Systems Review of Systems  Musculoskeletal: Positive for arthralgias (right wrist).  Neurological: Negative for numbness.  All other systems reviewed and are negative.    Physical Exam Updated Vital Signs BP 162/97   Pulse 71   Temp 98 F (36.7 C) (Oral)   Resp 18   SpO2 98%   Physical Exam  Constitutional: She is oriented to person, place, and time. She appears well-developed and well-nourished. No distress.  HENT:  Head: Normocephalic and atraumatic.  Eyes: EOM are normal.  Neck: Normal range of motion.  Cardiovascular: Normal rate.   Pulmonary/Chest: Effort normal.  Musculoskeletal:       Right wrist: She exhibits decreased range of motion, tenderness, bony tenderness, swelling and deformity. She exhibits no laceration.  Radial pulse 2+, adequate circulation.   Neurological: She is alert and oriented to person, place, and time. No sensory deficit.  Skin: Skin is warm and dry. She is not diaphoretic.  Psychiatric: She has a normal mood and affect. Judgment normal.  Nursing note and vitals reviewed.    ED Treatments / Results  DIAGNOSTIC STUDIES: Oxygen Saturation is 99% on RA, normal by my interpretation.  COORDINATION OF CARE: 8:10 PM-Will order medication for pain and wait for imaging results. Discussed treatment plan with pt at bedside and pt agreed to plan.   Labs (all labs ordered are listed, but only abnormal results are displayed) Labs  Reviewed - No data to display  Radiology Dg Wrist Complete Right  Result Date: 05/05/2016 CLINICAL DATA:  Right wrist injury EXAM: RIGHT WRIST - COMPLETE 3+ VIEW COMPARISON:  None. FINDINGS: There is a comminuted intra-articular right distal radius fracture with mild impaction of the fracture fragments and 6 mm dorsal displacement of the dominant distal fracture fragment. No additional fracture. No dislocation. Diffuse right wrist soft tissue swelling. No suspicious focal osseous lesion. No appreciable degenerative or erosive arthropathy. No radiopaque foreign body. IMPRESSION: Comminuted, impacted and mildly displaced intra-articular right distal radius fracture as described. Electronically Signed   By: Ilona Sorrel M.D.   On: 05/05/2016 20:10    Procedures Procedures (including critical care time)  Medications Ordered in ED Medications  oxyCODONE-acetaminophen (PERCOCET/ROXICET) 5-325 MG per tablet 1 tablet (1 tablet Oral Given 05/05/16 1944)  ibuprofen (ADVIL,MOTRIN) tablet 600 mg (600 mg Oral Given 05/05/16 2149)  oxyCODONE-acetaminophen (PERCOCET/ROXICET) 5-325 MG per tablet 1 tablet (1 tablet Oral Given 05/05/16 2149)     Initial Impression / Assessment and Plan / ED Course  I have reviewed the triage vital signs and the nursing notes.  Pertinent imaging results that were available during my care of the patient were reviewed by me and considered in my medical decision making (see chart for details).  Clinical Course  Dr. Rogene Houston in to see the patient and discuss x-ray findings and plan of care and need for f/u with hand for possible surgery.   Final Clinical Impressions(s) / ED Diagnoses  65 y.o. female with right wrist pain, swelling and deformity stable for d/c without focal neuro deficits. Sugar tongue splint applied, ice, elevation, sling and f/u with hand surgeon.  Final diagnoses:  Wrist fracture, right, closed, initial encounter  Fall (on) (from) other stairs and steps,  initial encounter    New Prescriptions Discharge Medication List as of 05/05/2016  9:27 PM    START taking these medications   Details  diclofenac (VOLTAREN) 50 MG EC tablet Take 1 tablet (50 mg total) by mouth 2 (two) times daily., Starting Tue 05/05/2016, Print    oxyCODONE-acetaminophen (PERCOCET/ROXICET) 5-325 MG tablet Take 2 tablets by mouth every 4 (four) hours as needed for severe pain., Starting Tue 05/05/2016, Print      I personally performed the services described in this documentation, which was scribed in my presence. The recorded information has been reviewed and is accurate.     [place], NP 05/07/16 [place], MD 05/08/16 [phone]

## 2016-05-05 NOTE — Discharge Instructions (Signed)
Because of the swelling and potential for more swelling we have placed you in a splint until you follow up with Dr. Fredna Dow. Call his office tomorrow to schedule an appointment.  Return immediately if you develop numbness in your fingers, notice the fingers feel cold or look blue or you have severe pain that is not controlled with your medication.

## 2016-05-05 NOTE — ED Triage Notes (Signed)
Pt sts right wrist pain after falling; CMS intact and swelling noted

## 2016-05-06 DIAGNOSIS — M25531 Pain in right wrist: Secondary | ICD-10-CM | POA: Diagnosis not present

## 2016-05-06 DIAGNOSIS — S52551A Other extraarticular fracture of lower end of right radius, initial encounter for closed fracture: Secondary | ICD-10-CM | POA: Diagnosis not present

## 2016-05-07 ENCOUNTER — Telehealth: Payer: Self-pay | Admitting: Family Medicine

## 2016-05-07 NOTE — Telephone Encounter (Signed)
Recv'd call from [place] needing recent office visits & labs for pt, this was faxed to [facility]

## 2016-05-08 DIAGNOSIS — S52571A Other intraarticular fracture of lower end of right radius, initial encounter for closed fracture: Secondary | ICD-10-CM | POA: Diagnosis not present

## 2016-05-08 DIAGNOSIS — G8918 Other acute postprocedural pain: Secondary | ICD-10-CM | POA: Diagnosis not present

## 2016-05-08 DIAGNOSIS — Y999 Unspecified external cause status: Secondary | ICD-10-CM | POA: Diagnosis not present

## 2016-05-11 ENCOUNTER — Ambulatory Visit (INDEPENDENT_AMBULATORY_CARE_PROVIDER_SITE_OTHER): Payer: Medicare Other | Admitting: Family Medicine

## 2016-05-11 ENCOUNTER — Encounter: Payer: Self-pay | Admitting: Family Medicine

## 2016-05-11 VITALS — BP 140/90 | HR 86 | Wt 264.0 lb

## 2016-05-11 DIAGNOSIS — S52571A Other intraarticular fracture of lower end of right radius, initial encounter for closed fracture: Secondary | ICD-10-CM

## 2016-05-11 DIAGNOSIS — R0781 Pleurodynia: Secondary | ICD-10-CM | POA: Diagnosis not present

## 2016-05-11 NOTE — Progress Notes (Signed)
   Subjective:    Patient ID: Barbara Cunningham, female    DOB: 1951-01-01, 65 y.o.   MRN: [id number]  HPI She fell injuring her right wrist on October 10 and was seen in the emergency room and referred for surgery. The surgery was performed on October 13. Since then she is also noted some left anterior chest discomfort, pleuritic in nature but no shortness of breath.   Review of Systems     Objective:   Physical Exam Alert and complaining of pain. The right wrist and forearm are in a cast. Ecchymosis noted to the left medial breast area with tenderness to palpation near the third and fourth costochondral junctions. Lungs are clear to auscultation. No tachypnea is noted. Splinting of the chest wall did decrease the pain.       Assessment & Plan:  Other closed intra-articular fracture of distal end of right radius, initial encounter  Rib pain on left side - Plan: DG Chest 2 View Symptoms are consistent with a rib fracture although could be a costochondral injury. She is on pain medication. I will get an x-ray to ensure there is no pneumothorax. Explained that since she is down the road at this point, most likely nothing needs be done other than pain relief.

## 2016-05-11 NOTE — Patient Instructions (Signed)
Stay on your pain medicine but if the pain or shortness of breath gets worse go to the hospital

## 2016-05-13 ENCOUNTER — Ambulatory Visit
Admission: RE | Admit: 2016-05-13 | Discharge: 2016-05-13 | Disposition: A | Discharge status: Home / Self Care | Payer: Medicare Other | Source: Ambulatory Visit | Attending: Family Medicine | Admitting: Family Medicine

## 2016-05-13 DIAGNOSIS — R079 Chest pain, unspecified: Secondary | ICD-10-CM | POA: Diagnosis not present

## 2016-05-13 DIAGNOSIS — R0781 Pleurodynia: Secondary | ICD-10-CM

## 2016-05-15 ENCOUNTER — Telehealth: Payer: Self-pay | Admitting: Family Medicine

## 2016-05-15 NOTE — Telephone Encounter (Signed)
I left a message so apparently she didn't get it. Let her know that the x-ray shows no broken bones and does show atelectasis indicating that she needs to take a deep breath every so often to open upper lungs are little bit.

## 2016-05-15 NOTE — Telephone Encounter (Signed)
Pt informed word for word verbalized understanding

## 2016-05-15 NOTE — Telephone Encounter (Signed)
Pt called and is wanting to know the results from the x-ray where in and if you could call her with the results [phone]

## 2016-05-22 DIAGNOSIS — S52571D Other intraarticular fracture of lower end of right radius, subsequent encounter for closed fracture with routine healing: Secondary | ICD-10-CM | POA: Diagnosis not present

## 2016-05-22 DIAGNOSIS — Z4789 Encounter for other orthopedic aftercare: Secondary | ICD-10-CM | POA: Diagnosis not present

## 2016-06-05 DIAGNOSIS — S52571D Other intraarticular fracture of lower end of right radius, subsequent encounter for closed fracture with routine healing: Secondary | ICD-10-CM | POA: Diagnosis not present

## 2016-06-05 DIAGNOSIS — Z4789 Encounter for other orthopedic aftercare: Secondary | ICD-10-CM | POA: Diagnosis not present

## 2016-06-09 DIAGNOSIS — S52501D Unspecified fracture of the lower end of right radius, subsequent encounter for closed fracture with routine healing: Secondary | ICD-10-CM | POA: Diagnosis not present

## 2016-06-12 DIAGNOSIS — S52501D Unspecified fracture of the lower end of right radius, subsequent encounter for closed fracture with routine healing: Secondary | ICD-10-CM | POA: Diagnosis not present

## 2016-06-15 DIAGNOSIS — S52501D Unspecified fracture of the lower end of right radius, subsequent encounter for closed fracture with routine healing: Secondary | ICD-10-CM | POA: Diagnosis not present

## 2016-06-17 DIAGNOSIS — S52501D Unspecified fracture of the lower end of right radius, subsequent encounter for closed fracture with routine healing: Secondary | ICD-10-CM | POA: Diagnosis not present

## 2016-06-22 DIAGNOSIS — S52501D Unspecified fracture of the lower end of right radius, subsequent encounter for closed fracture with routine healing: Secondary | ICD-10-CM | POA: Diagnosis not present

## 2016-06-24 DIAGNOSIS — S52501D Unspecified fracture of the lower end of right radius, subsequent encounter for closed fracture with routine healing: Secondary | ICD-10-CM | POA: Diagnosis not present

## 2016-06-29 DIAGNOSIS — Z4789 Encounter for other orthopedic aftercare: Secondary | ICD-10-CM | POA: Diagnosis not present

## 2016-06-29 DIAGNOSIS — S52501D Unspecified fracture of the lower end of right radius, subsequent encounter for closed fracture with routine healing: Secondary | ICD-10-CM | POA: Diagnosis not present

## 2016-07-02 DIAGNOSIS — S52501G Unspecified fracture of the lower end of right radius, subsequent encounter for closed fracture with delayed healing: Secondary | ICD-10-CM | POA: Diagnosis not present

## 2016-07-02 DIAGNOSIS — S52501D Unspecified fracture of the lower end of right radius, subsequent encounter for closed fracture with routine healing: Secondary | ICD-10-CM | POA: Diagnosis not present

## 2016-07-07 DIAGNOSIS — S52501D Unspecified fracture of the lower end of right radius, subsequent encounter for closed fracture with routine healing: Secondary | ICD-10-CM | POA: Diagnosis not present

## 2016-07-10 ENCOUNTER — Other Ambulatory Visit (INDEPENDENT_AMBULATORY_CARE_PROVIDER_SITE_OTHER): Payer: Medicare Other

## 2016-07-10 DIAGNOSIS — R8290 Unspecified abnormal findings in urine: Secondary | ICD-10-CM | POA: Diagnosis not present

## 2016-07-10 DIAGNOSIS — S52501D Unspecified fracture of the lower end of right radius, subsequent encounter for closed fracture with routine healing: Secondary | ICD-10-CM | POA: Diagnosis not present

## 2016-07-10 LAB — POCT URINALYSIS DIPSTICK
Bilirubin, UA: NEGATIVE
GLUCOSE UA: NEGATIVE
Ketones, UA: NEGATIVE
Leukocytes, UA: NEGATIVE
NITRITE UA: NEGATIVE
PH UA: 6
PROTEIN UA: NEGATIVE
RBC UA: NEGATIVE
UROBILINOGEN UA: NEGATIVE

## 2016-07-13 ENCOUNTER — Ambulatory Visit (INDEPENDENT_AMBULATORY_CARE_PROVIDER_SITE_OTHER): Payer: Medicare Other | Admitting: Family Medicine

## 2016-07-13 ENCOUNTER — Other Ambulatory Visit: Payer: Self-pay | Admitting: Psychiatry

## 2016-07-13 ENCOUNTER — Encounter: Payer: Self-pay | Admitting: Family Medicine

## 2016-07-13 ENCOUNTER — Other Ambulatory Visit: Payer: Self-pay | Admitting: Family Medicine

## 2016-07-13 VITALS — BP 142/100 | HR 68 | Wt 262.0 lb

## 2016-07-13 DIAGNOSIS — R109 Unspecified abdominal pain: Secondary | ICD-10-CM

## 2016-07-13 DIAGNOSIS — Z87442 Personal history of urinary calculi: Secondary | ICD-10-CM | POA: Diagnosis not present

## 2016-07-13 DIAGNOSIS — Z8619 Personal history of other infectious and parasitic diseases: Secondary | ICD-10-CM

## 2016-07-13 DIAGNOSIS — S52501D Unspecified fracture of the lower end of right radius, subsequent encounter for closed fracture with routine healing: Secondary | ICD-10-CM | POA: Diagnosis not present

## 2016-07-13 DIAGNOSIS — R10A0 Flank pain, unspecified side: Secondary | ICD-10-CM

## 2016-07-13 NOTE — Progress Notes (Signed)
   Subjective:    Patient ID: Mekeba Eledge, female    DOB: 02/22/1951, 65 y.o.   MRN: [id number]  HPI She has had difficulty with intermittent left flank pain for several years. She has a previous history of surgery for renal stone removal in 1981. Does ease off slightly with laying down but usually position has no fact on this. She's had no dysuria frequency or urgency. Bowel habits of been normal. No nausea, vomiting or diarrhea. He does have a previous history of hepatitis C however her viral load is negative and has been so for the last year.   Review of Systems     Objective:   Physical Exam Alert and in moderate distress. Slight discomfort in the left CVA but motion makes no difference. Abdominal exam shows no masses or tenderness. Recent UA was negative.       Assessment & Plan:  Left flank pain  History of renal stone  History of hepatitis C Set her up for another CT scan and if this is negative, I will still refer to urology for further evaluation

## 2016-07-14 ENCOUNTER — Ambulatory Visit
Admission: RE | Admit: 2016-07-14 | Discharge: 2016-07-14 | Disposition: A | Discharge status: Home / Self Care | Payer: Medicare Other | Source: Ambulatory Visit | Attending: Family Medicine | Admitting: Family Medicine

## 2016-07-14 DIAGNOSIS — R10A0 Flank pain, unspecified side: Secondary | ICD-10-CM

## 2016-07-14 DIAGNOSIS — R109 Unspecified abdominal pain: Secondary | ICD-10-CM

## 2016-07-14 DIAGNOSIS — N200 Calculus of kidney: Secondary | ICD-10-CM | POA: Diagnosis not present

## 2016-07-17 DIAGNOSIS — S52501D Unspecified fracture of the lower end of right radius, subsequent encounter for closed fracture with routine healing: Secondary | ICD-10-CM | POA: Diagnosis not present

## 2016-07-22 DIAGNOSIS — S52501D Unspecified fracture of the lower end of right radius, subsequent encounter for closed fracture with routine healing: Secondary | ICD-10-CM | POA: Diagnosis not present

## 2016-07-24 DIAGNOSIS — S52501D Unspecified fracture of the lower end of right radius, subsequent encounter for closed fracture with routine healing: Secondary | ICD-10-CM | POA: Diagnosis not present

## 2016-07-28 DIAGNOSIS — S52501D Unspecified fracture of the lower end of right radius, subsequent encounter for closed fracture with routine healing: Secondary | ICD-10-CM | POA: Diagnosis not present

## 2016-07-29 ENCOUNTER — Telehealth: Payer: Self-pay | Admitting: Family Medicine

## 2016-07-29 DIAGNOSIS — Z4789 Encounter for other orthopedic aftercare: Secondary | ICD-10-CM | POA: Diagnosis not present

## 2016-07-29 DIAGNOSIS — F909 Attention-deficit hyperactivity disorder, unspecified type: Secondary | ICD-10-CM

## 2016-07-29 DIAGNOSIS — S52501D Unspecified fracture of the lower end of right radius, subsequent encounter for closed fracture with routine healing: Secondary | ICD-10-CM | POA: Diagnosis not present

## 2016-07-29 MED ORDER — AMPHETAMINE-DEXTROAMPHETAMINE 20 MG PO TABS
20.0000 mg | ORAL_TABLET | Freq: Two times a day (BID) | ORAL | 0 refills | Status: DC
Start: 2016-07-29 — End: 2016-09-17

## 2016-07-29 NOTE — Telephone Encounter (Signed)
Pt called and would like a refill on her adderall pt can be reached at [phone] when ready to be picked up

## 2016-08-03 DIAGNOSIS — K7469 Other cirrhosis of liver: Secondary | ICD-10-CM | POA: Diagnosis not present

## 2016-08-05 ENCOUNTER — Other Ambulatory Visit: Payer: Self-pay | Admitting: Nurse Practitioner

## 2016-08-05 DIAGNOSIS — K7400 Hepatic fibrosis, unspecified: Secondary | ICD-10-CM

## 2016-08-17 ENCOUNTER — Telehealth: Payer: Self-pay | Admitting: Family Medicine

## 2016-08-17 NOTE — Telephone Encounter (Signed)
Pt has appointment 08/24/16 at 2:45 with [facility] they have left a message for her we have mailed copy of appointment and I just called and had to leave message of appointment

## 2016-08-17 NOTE — Telephone Encounter (Signed)
Pt left message on my voice mail that at her last ov she was supposed to be referred.  Cheri please call pt [phone] regarding there referral.  Ov note shows urology referral in the plan, not sure you knew.

## 2016-08-24 ENCOUNTER — Ambulatory Visit
Admission: RE | Admit: 2016-08-24 | Discharge: 2016-08-24 | Disposition: A | Discharge status: Home / Self Care | Payer: Medicare Other | Source: Ambulatory Visit | Attending: Nurse Practitioner | Admitting: Nurse Practitioner

## 2016-08-24 DIAGNOSIS — K7400 Hepatic fibrosis, unspecified: Secondary | ICD-10-CM

## 2016-08-24 DIAGNOSIS — K8020 Calculus of gallbladder without cholecystitis without obstruction: Secondary | ICD-10-CM | POA: Diagnosis not present

## 2016-09-17 ENCOUNTER — Telehealth: Payer: Self-pay

## 2016-09-17 ENCOUNTER — Other Ambulatory Visit: Payer: Self-pay | Admitting: Medical

## 2016-09-17 DIAGNOSIS — F909 Attention-deficit hyperactivity disorder, unspecified type: Secondary | ICD-10-CM

## 2016-09-17 MED ORDER — AMPHETAMINE-DEXTROAMPHETAMINE 20 MG PO TABS: 20.0000 mg | ORAL_TABLET | Freq: Two times a day (BID) | ORAL | 0 refills | Status: DC

## 2016-09-17 NOTE — Telephone Encounter (Signed)
Pt called to request refill of Adderall.

## 2016-09-17 NOTE — Telephone Encounter (Signed)
30 day supply ready for pickup

## 2016-09-17 NOTE — Telephone Encounter (Signed)
Pt aware at front desk for pick up. Barbara Cunningham

## 2016-10-14 ENCOUNTER — Encounter: Payer: Self-pay | Admitting: Family Medicine

## 2016-10-14 ENCOUNTER — Ambulatory Visit (INDEPENDENT_AMBULATORY_CARE_PROVIDER_SITE_OTHER): Payer: Medicare Other | Admitting: Family Medicine

## 2016-10-14 VITALS — BP 130/80 | HR 67 | Wt 263.0 lb

## 2016-10-14 DIAGNOSIS — M25422 Effusion, left elbow: Secondary | ICD-10-CM | POA: Diagnosis not present

## 2016-10-14 NOTE — Progress Notes (Signed)
   Subjective:    Patient ID: Barbara Cunningham, female    DOB: 07/17/1951, 66 y.o.   MRN: [id number]  HPI She is here having a concern about swelling of the left elbow. She's had no pain, redness, increase in heat or history of recent injury.   Review of Systems     Objective:   Physical Exam alert and in no distress. Exam of left elbow does show a very prominent olecranon bursa proximally 3-4 cm in size. It is not hot red or tender.      Assessment & Plan:  Effusion of left olecranon bursa I explained that this is not an unusual situation and nothing needs to be done unless signs and symptoms of infection occur. She was comfortable with that.

## 2016-10-19 DIAGNOSIS — N200 Calculus of kidney: Secondary | ICD-10-CM | POA: Diagnosis not present

## 2016-10-19 DIAGNOSIS — R1084 Generalized abdominal pain: Secondary | ICD-10-CM | POA: Diagnosis not present

## 2016-10-19 DIAGNOSIS — N281 Cyst of kidney, acquired: Secondary | ICD-10-CM | POA: Diagnosis not present

## 2017-01-07 IMAGING — CR DG WRIST COMPLETE 3+V*R*
4 series · 4 of 4 positions shown · non-contrast
Comparison: None.

CLINICAL DATA: Right wrist injury

EXAM:
RIGHT WRIST - COMPLETE 3+ VIEW

[wrist pa]
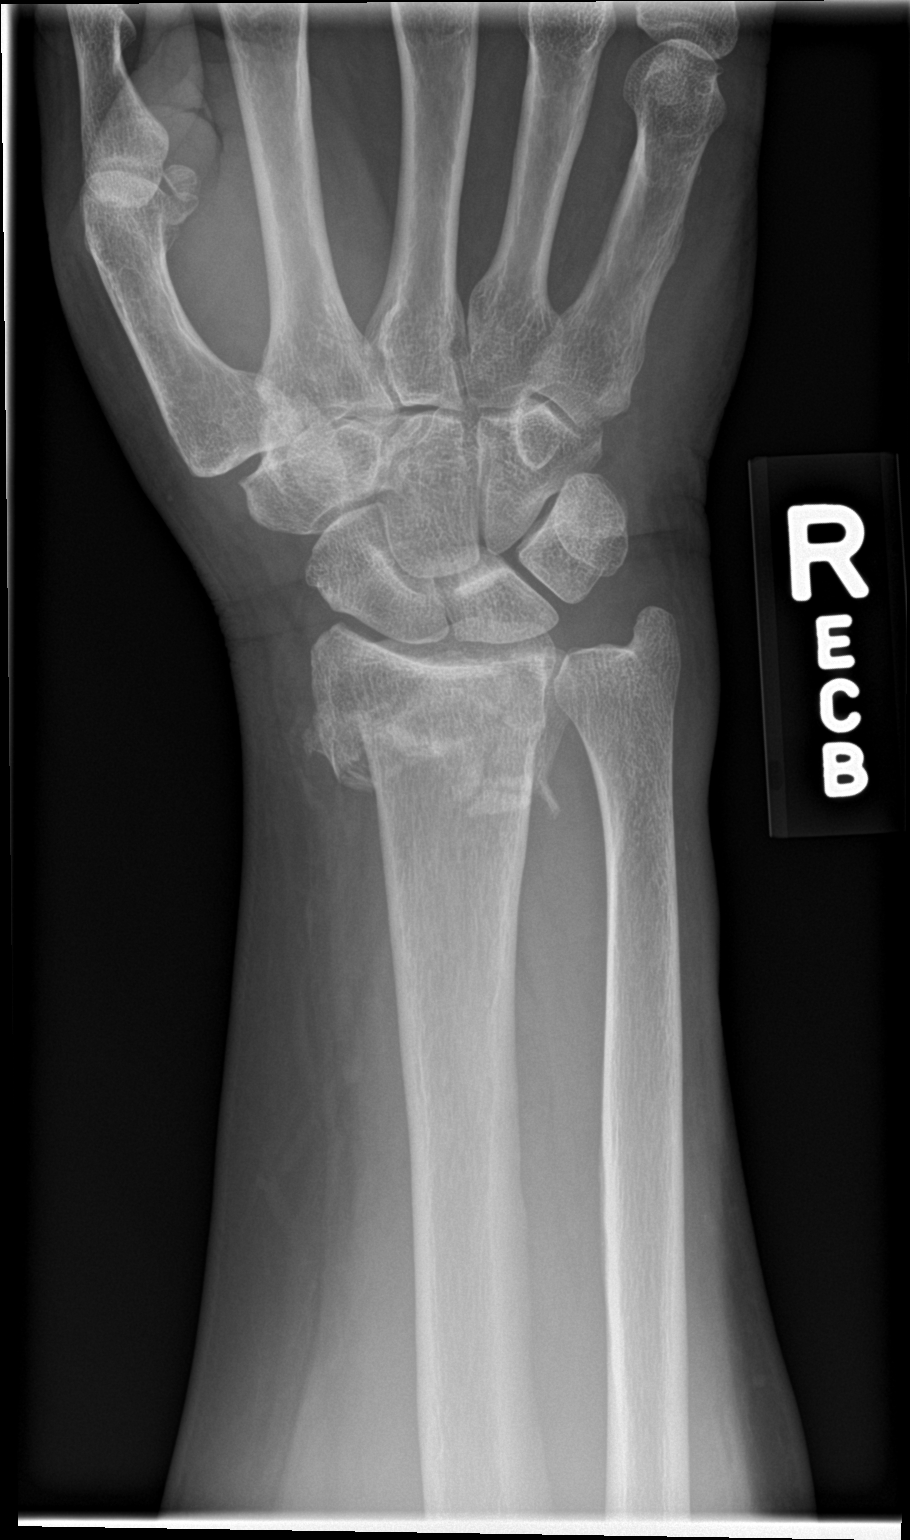

[wrist obl]
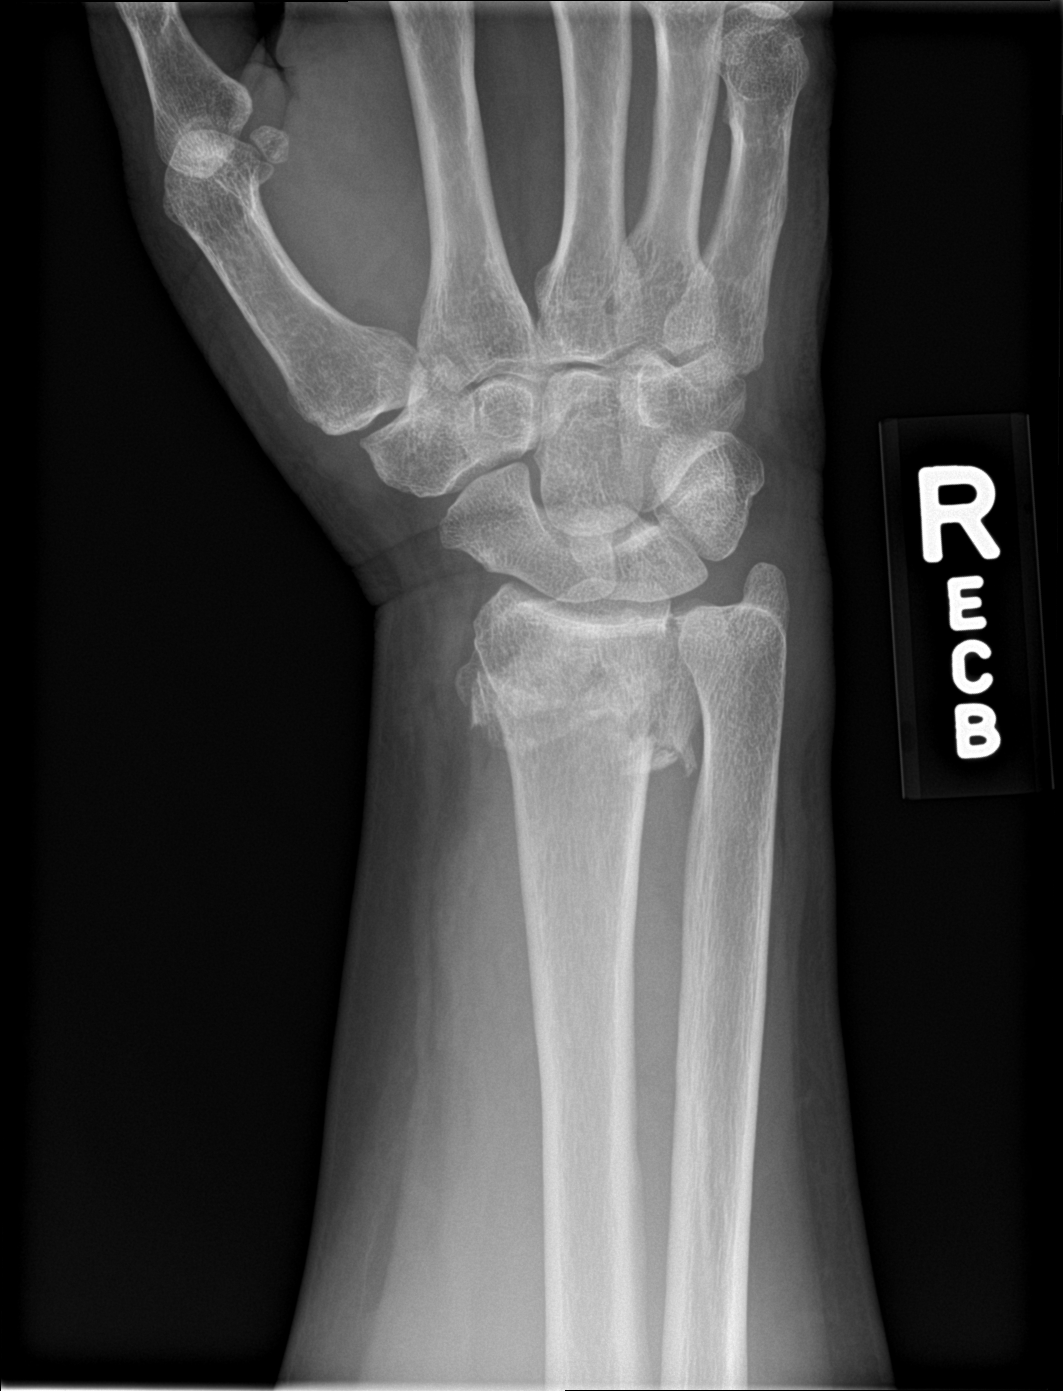

[wrist lat]
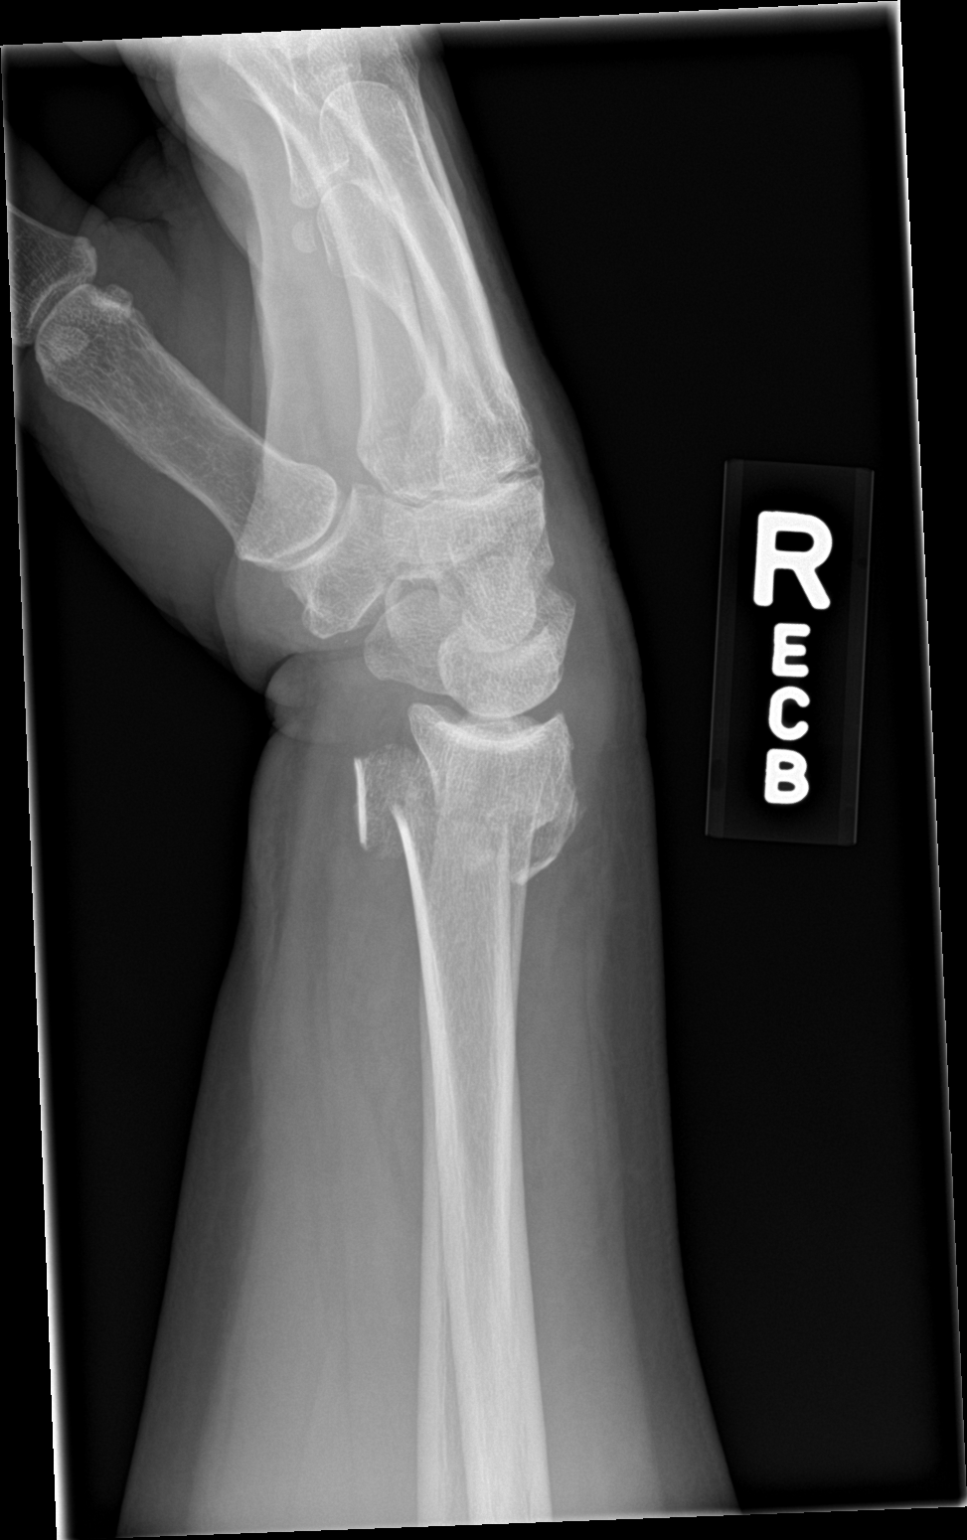

[wrist navicular]
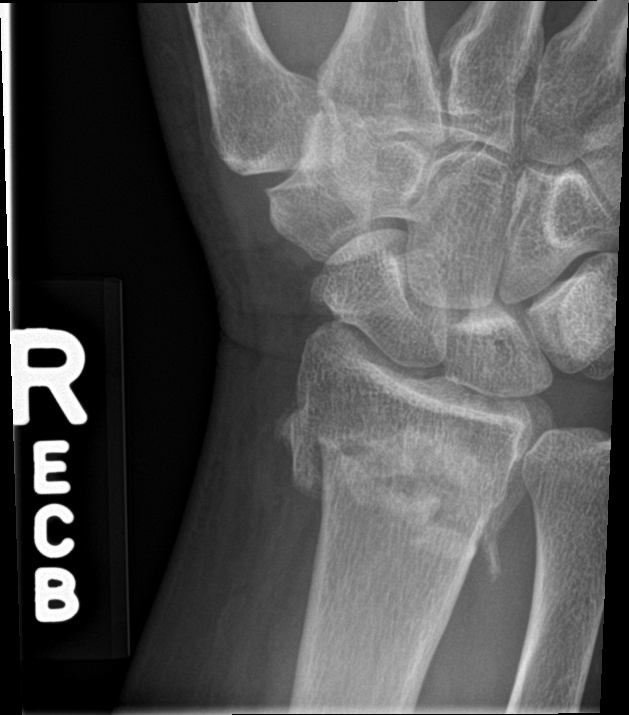

[4 of 4 positions shown; findings below may reference images not displayed]

FINDINGS: There is a comminuted intra-articular right distal radius fracture
with mild impaction of the fracture fragments and 6 mm dorsal
displacement of the dominant distal fracture fragment. No additional
fracture. No dislocation. Diffuse right wrist soft tissue swelling.
No suspicious focal osseous lesion. No appreciable degenerative or
erosive arthropathy. No radiopaque foreign body.
IMPRESSION: Comminuted, impacted and mildly displaced intra-articular right
distal radius fracture as described.

## 2017-01-15 IMAGING — CR DG CHEST 2V
3 series · 3 of 3 positions shown · non-contrast
Comparison: Radiographs November 02, 2012.

CLINICAL DATA: Left-sided chest pain after fall 8 days ago.

EXAM:
CHEST  2 VIEW

[w chest pa]
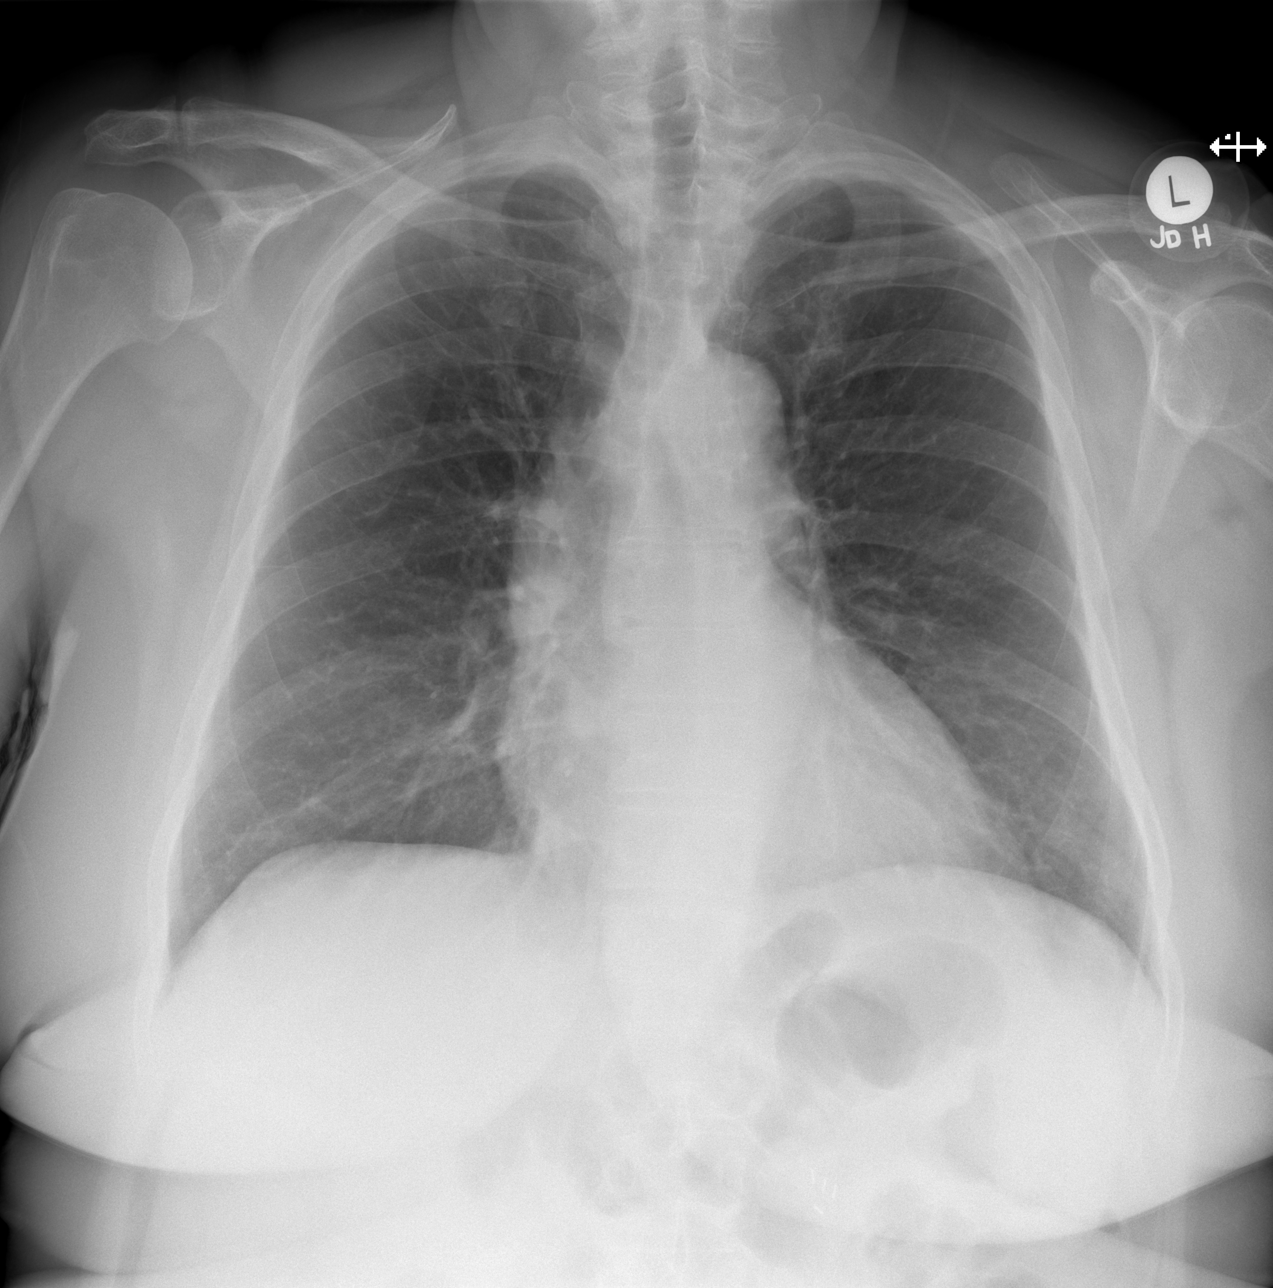

[w chest lat]
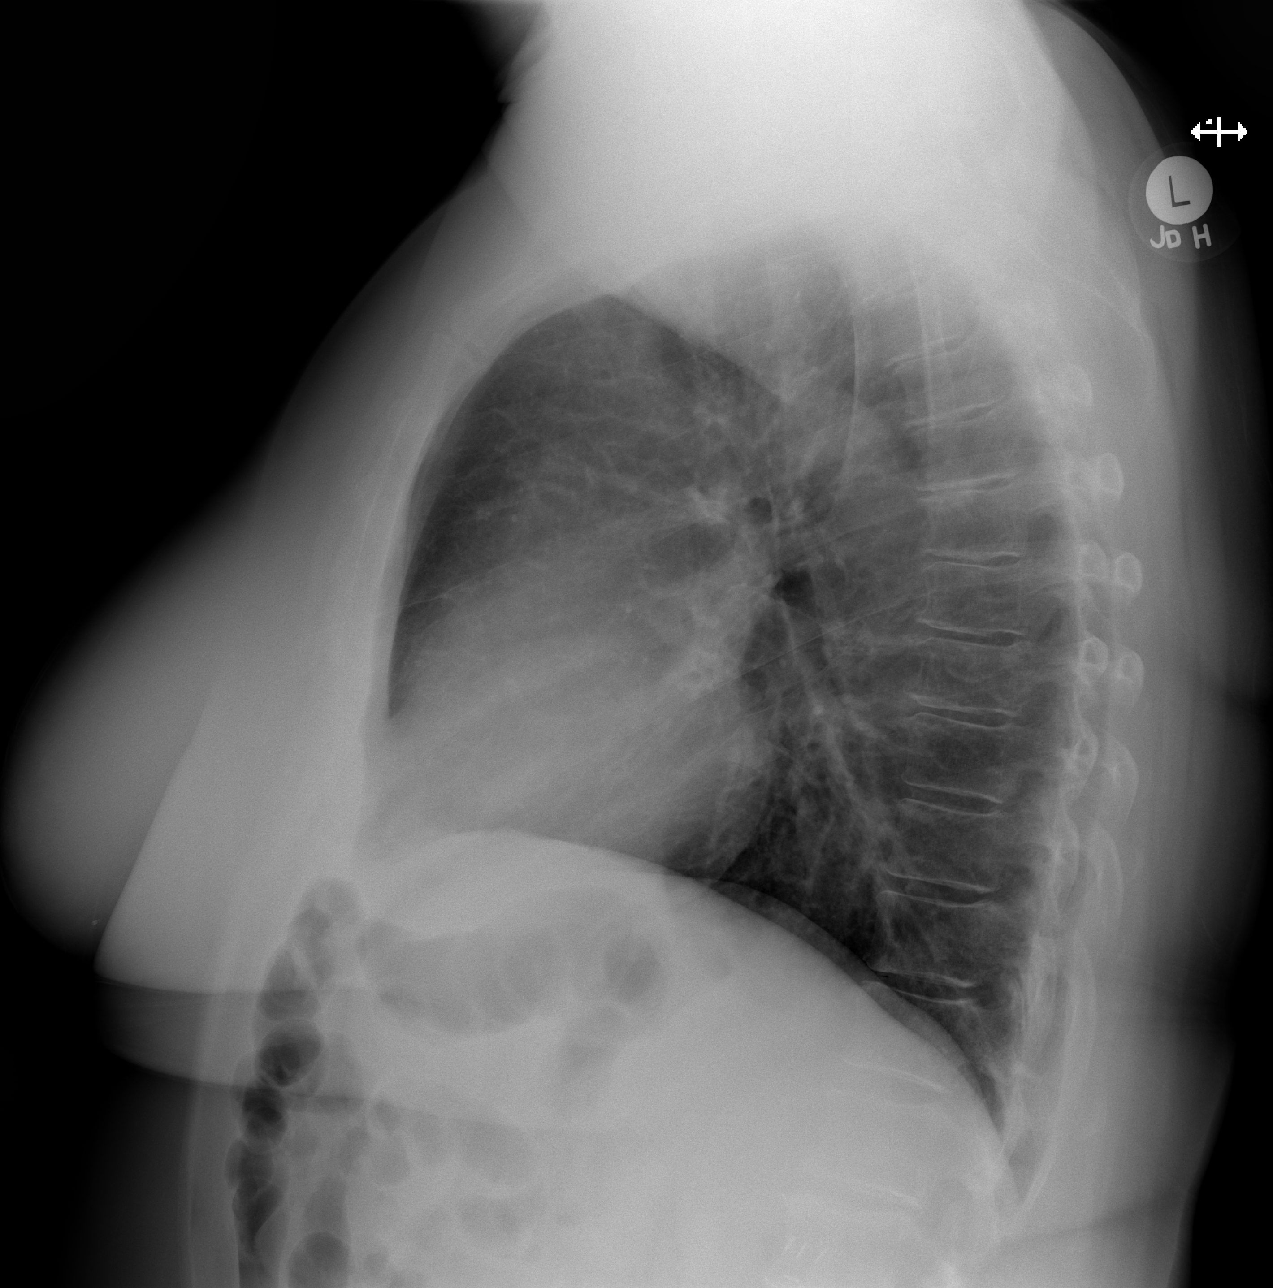

[w ribs ap lower left]
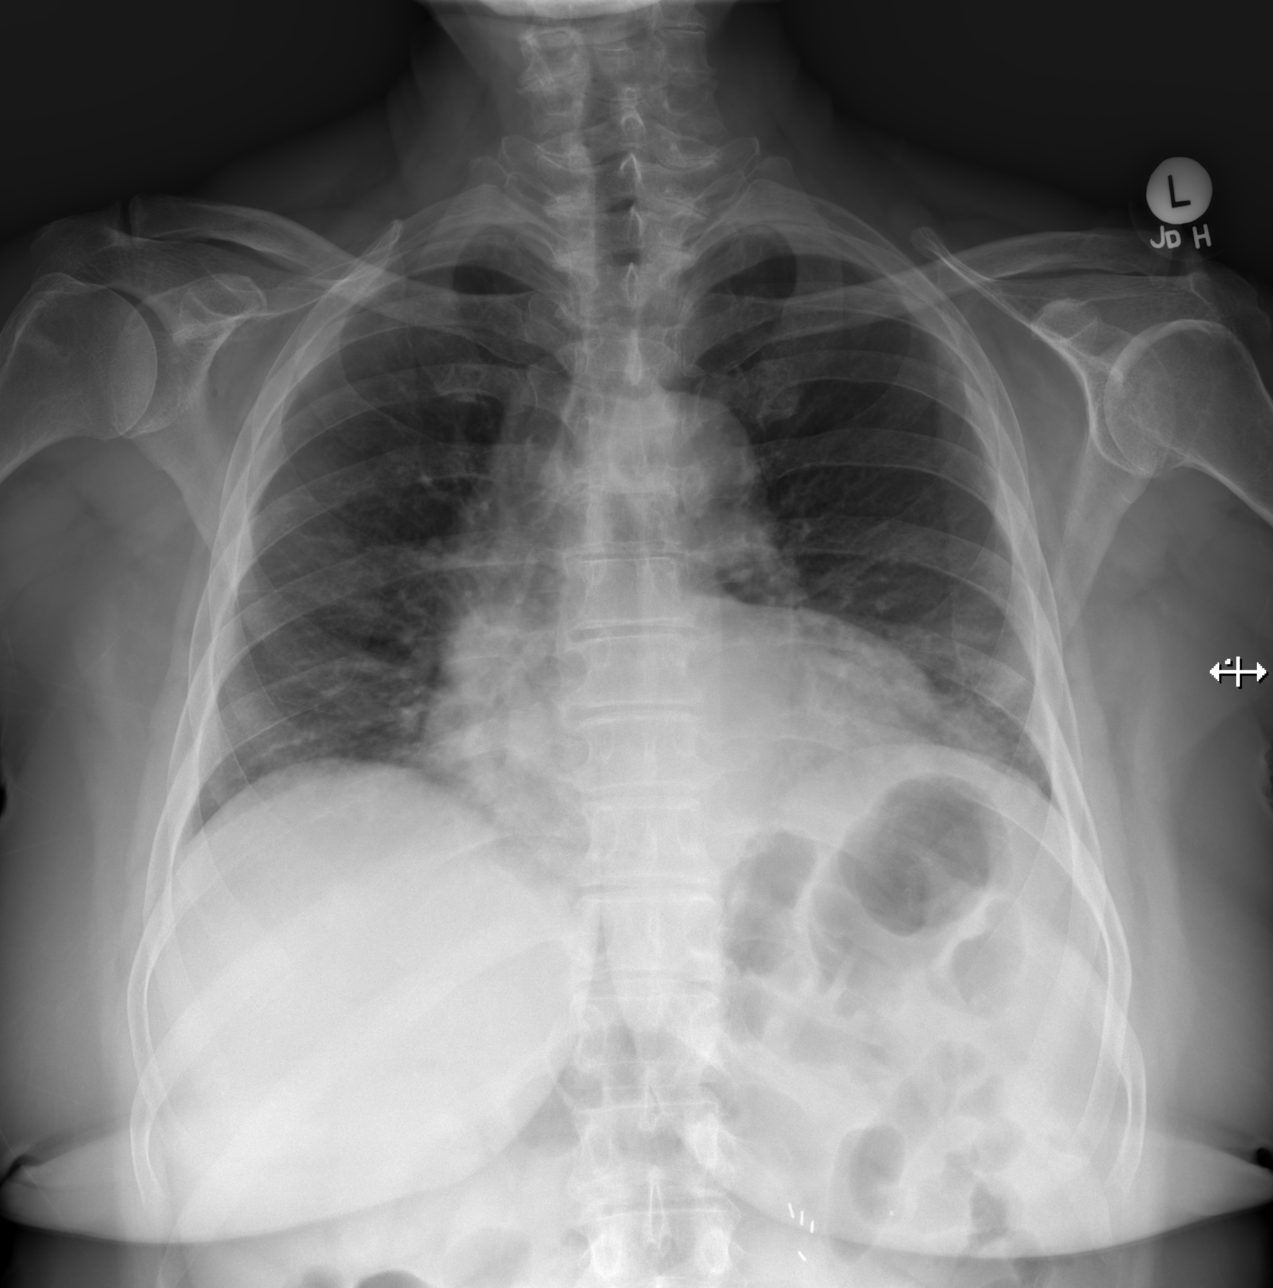

[3 of 3 positions shown; findings below may reference images not displayed]

FINDINGS: Stable cardiomediastinal silhouette. No pneumothorax or pleural
effusion is noted. Bony thorax is unremarkable. Minimal right
basilar subsegmental atelectasis is noted. Mild left basilar opacity
is noted concerning for atelectasis or infiltrate.
IMPRESSION: Mild left basilar atelectasis or infiltrate. Follow-up radiographs
are recommended.

## 2017-02-02 DIAGNOSIS — K740 Hepatic fibrosis: Secondary | ICD-10-CM | POA: Diagnosis not present

## 2017-02-08 DIAGNOSIS — K740 Hepatic fibrosis: Secondary | ICD-10-CM | POA: Diagnosis not present

## 2017-02-09 ENCOUNTER — Other Ambulatory Visit: Payer: Self-pay | Admitting: Nurse Practitioner

## 2017-02-09 DIAGNOSIS — K7469 Other cirrhosis of liver: Secondary | ICD-10-CM

## 2017-02-17 ENCOUNTER — Ambulatory Visit
Admission: RE | Admit: 2017-02-17 | Discharge: 2017-02-17 | Disposition: A | Discharge status: Home / Self Care | Payer: Medicare Other | Source: Ambulatory Visit | Attending: Nurse Practitioner | Admitting: Nurse Practitioner

## 2017-02-17 DIAGNOSIS — K8020 Calculus of gallbladder without cholecystitis without obstruction: Secondary | ICD-10-CM | POA: Diagnosis not present

## 2017-02-17 DIAGNOSIS — K7469 Other cirrhosis of liver: Secondary | ICD-10-CM

## 2017-02-17 DIAGNOSIS — K7460 Unspecified cirrhosis of liver: Secondary | ICD-10-CM | POA: Diagnosis not present

## 2017-04-12 ENCOUNTER — Ambulatory Visit (INDEPENDENT_AMBULATORY_CARE_PROVIDER_SITE_OTHER): Payer: Medicare Other | Admitting: Family Medicine

## 2017-04-12 ENCOUNTER — Encounter: Payer: Self-pay | Admitting: Family Medicine

## 2017-04-12 VITALS — BP 140/80 | HR 68 | Temp 97.9°F | Resp 18 | Wt 261.2 lb

## 2017-04-12 DIAGNOSIS — R300 Dysuria: Secondary | ICD-10-CM

## 2017-04-12 DIAGNOSIS — N3000 Acute cystitis without hematuria: Secondary | ICD-10-CM | POA: Diagnosis not present

## 2017-04-12 LAB — POCT URINALYSIS DIP (PROADVANTAGE DEVICE)
Bilirubin, UA: NEGATIVE
Blood, UA: NEGATIVE
Glucose, UA: NEGATIVE mg/dL
Ketones, POC UA: NEGATIVE mg/dL
NITRITE UA: NEGATIVE
PH UA: 6 (ref 5.0–8.0)
PROTEIN UA: NEGATIVE mg/dL
Specific Gravity, Urine: 1.03
Urobilinogen, Ur: NEGATIVE

## 2017-04-12 MED ORDER — NITROFURANTOIN MONOHYD MACRO 100 MG PO CAPS
100.0000 mg | ORAL_CAPSULE | Freq: Two times a day (BID) | ORAL | 0 refills | Status: DC
Start: 2017-04-12 — End: 2017-09-21

## 2017-04-12 NOTE — Progress Notes (Signed)
   Subjective:    Patient ID: Barbara Cunningham, female    DOB: 02/21/1951, 66 y.o.   MRN: [id number]  HPI She complains of the acute onset of left flank pain and some dysuria. She has had difficulty for the last several decades with pain like this that has been so far undiagnosable but not always related to UTI. No fever, chills, hematuria, nausea vomiting and she has been seen by urology in the recent past.   Review of Systems     Objective:   Physical Exam Alert and in no distress. Urine microscopic showed trace white cells       Assessment & Plan:  Acute cystitis without hematuria - Plan: nitrofurantoin, macrocrystal-monohydrate, (MACROBID) 100 MG capsule  Dysuria - Plan: POCT Urinalysis DIP (Proadvantage Device)  The diagnosis certainly could be UTI but previously 6 parents with his is equivocal. I will go ahead and treat her and if no response reevaluate.

## 2017-04-23 DIAGNOSIS — H04123 Dry eye syndrome of bilateral lacrimal glands: Secondary | ICD-10-CM | POA: Diagnosis not present

## 2017-04-23 DIAGNOSIS — H2513 Age-related nuclear cataract, bilateral: Secondary | ICD-10-CM | POA: Diagnosis not present

## 2017-05-11 ENCOUNTER — Telehealth: Payer: Self-pay | Admitting: Family Medicine

## 2017-05-11 DIAGNOSIS — F909 Attention-deficit hyperactivity disorder, unspecified type: Secondary | ICD-10-CM

## 2017-05-11 MED ORDER — AMPHETAMINE-DEXTROAMPHETAMINE 20 MG PO TABS: 20.0000 mg | ORAL_TABLET | Freq: Two times a day (BID) | ORAL | 0 refills | Status: DC

## 2017-05-11 NOTE — Telephone Encounter (Signed)
Pt left message needs Adderall RF

## 2017-09-21 ENCOUNTER — Encounter: Payer: Self-pay | Admitting: Family Medicine

## 2017-09-21 ENCOUNTER — Ambulatory Visit (INDEPENDENT_AMBULATORY_CARE_PROVIDER_SITE_OTHER): Payer: Medicare Other | Admitting: Family Medicine

## 2017-09-21 VITALS — BP 146/98 | HR 68 | Temp 97.6°F | Wt 264.0 lb

## 2017-09-21 DIAGNOSIS — R030 Elevated blood-pressure reading, without diagnosis of hypertension: Secondary | ICD-10-CM | POA: Diagnosis not present

## 2017-09-21 DIAGNOSIS — R310 Gross hematuria: Secondary | ICD-10-CM | POA: Diagnosis not present

## 2017-09-21 DIAGNOSIS — R109 Unspecified abdominal pain: Secondary | ICD-10-CM

## 2017-09-21 DIAGNOSIS — Z87442 Personal history of urinary calculi: Secondary | ICD-10-CM | POA: Diagnosis not present

## 2017-09-21 LAB — POCT URINALYSIS DIP (PROADVANTAGE DEVICE)
Bilirubin, UA: NEGATIVE
Glucose, UA: NEGATIVE mg/dL
Ketones, POC UA: NEGATIVE mg/dL
Leukocytes, UA: NEGATIVE
NITRITE UA: NEGATIVE
PH UA: 6 (ref 5.0–8.0)
SPECIFIC GRAVITY, URINE: 1.025
UUROB: 3.5

## 2017-09-21 NOTE — Progress Notes (Signed)
   Subjective:    Patient ID: Barbara Cunningham, female    DOB: 18-Dec-1950, 67 y.o.   MRN: [id number]  HPI Chief Complaint  Patient presents with  . other    possible kidney stone, lower back pain  been going  since sunday   She is here to discuss a 3 day history of right flank pain and vomiting that has resolved. States she passed a kidney stone at 4 am today and feels back to her baseline. She does still have blood in her urine. She has not been drinking enough water.  Denies urinary frequency, urgency, hesitancy. States she has a history of UTI and this does not feel like she has a UTI.  She has not taken anything for her symptoms.   History of kidney stones and surgery in 1981.   Denies fever, chills, dizziness, chest pain, palpitations, shortness of breath, abdominal pain, N/V/D, urinary symptoms, LE edema.   Denies history of HTN. States she did not sleep at all last night and has been stressed today.   Reviewed allergies, medications, past medical, surgical, family, and social history.    Review of Systems Pertinent positives and negatives in the history of present illness.     Objective:   Physical Exam BP (!) 146/98 (BP Location: Left Arm, Patient Position: Sitting)   Pulse 68   Temp 97.6 F (36.4 C)   Wt 264 lb (119.7 kg)   SpO2 97%   BMI 42.61 kg/m   Alert and in no distress.  Pharyngeal area is normal. Neck is supple without adenopathy or thyromegaly. Cardiac exam shows a regular sinus rhythm without murmurs or gallops. Lungs are clear to auscultation.  Abdomen soft, nondistended, normal bowel sounds, nontender, no masses.  No CVA tenderness.       Assessment & Plan:  Right flank pain - Plan: POCT Urinalysis DIP (Proadvantage Device)  Gross hematuria - Plan: Urine Culture, POCT Urinalysis DIP (Proadvantage Device)  History of renal calculi - Plan: POCT Urinalysis DIP (Proadvantage Device)  Elevated BP without diagnosis of hypertension  She appears to  have passed a kidney stone at 4 am this morning based on history. Gross hematuria present. Denies urinary symptoms. Urine culture sent.   Offered Toradol injection or pain medication but she declines and states she is back to baseline in regards to pain.  Discussed that her BP is elevated and this should be rechecked. She declines to have this rechecked today.  I also advised that she is overdue for a CPE and preventive healthcare. She will call to schedule.  She will follow up for a lab visit to have her urine rechecked next Monday. Follow up pending urine culture as well.

## 2017-09-21 NOTE — Patient Instructions (Signed)
Return Monday for a urine recheck

## 2017-09-22 LAB — URINE CULTURE

## 2017-09-27 ENCOUNTER — Ambulatory Visit (INDEPENDENT_AMBULATORY_CARE_PROVIDER_SITE_OTHER): Payer: Medicare Other | Admitting: Family Medicine

## 2017-09-27 ENCOUNTER — Other Ambulatory Visit: Payer: Medicare Other

## 2017-09-27 VITALS — BP 148/98 | HR 65 | Wt 261.0 lb

## 2017-09-27 DIAGNOSIS — R1011 Right upper quadrant pain: Secondary | ICD-10-CM

## 2017-09-27 DIAGNOSIS — R8290 Unspecified abnormal findings in urine: Secondary | ICD-10-CM

## 2017-09-27 DIAGNOSIS — R109 Unspecified abdominal pain: Secondary | ICD-10-CM

## 2017-09-27 DIAGNOSIS — R319 Hematuria, unspecified: Secondary | ICD-10-CM

## 2017-09-27 DIAGNOSIS — Z87442 Personal history of urinary calculi: Secondary | ICD-10-CM

## 2017-09-27 LAB — POCT URINALYSIS DIP (PROADVANTAGE DEVICE)
BILIRUBIN UA: NEGATIVE
BILIRUBIN UA: NEGATIVE mg/dL
GLUCOSE UA: NEGATIVE mg/dL
Nitrite, UA: NEGATIVE
Specific Gravity, Urine: 1.025
Urobilinogen, Ur: 3.5
pH, UA: 6 (ref 5.0–8.0)

## 2017-09-27 MED ORDER — HYDROCODONE-ACETAMINOPHEN 5-325 MG PO TABS: 1.0000 | ORAL_TABLET | Freq: Four times a day (QID) | ORAL | 0 refills | Status: DC | PRN

## 2017-09-27 MED ORDER — KETOROLAC TROMETHAMINE 60 MG/2ML IM SOLN
60.0000 mg | Freq: Once | INTRAMUSCULAR | Status: AC
  Administered 2017-09-27: 60 mg via INTRAMUSCULAR

## 2017-09-27 NOTE — Addendum Note (Signed)
Addended by: Denita Lung on: 09/27/2017 04:55 PM   Modules accepted: Orders

## 2017-09-27 NOTE — Progress Notes (Signed)
   Subjective:    Patient ID: Barbara Cunningham, female    DOB: 1951/04/13, 67 y.o.   MRN: [id number]  HPI She was seen last Tuesday and evaluated for right CVA pain however the pain went away and no medications were given.  She does have a previous history of renal stone with surgery on the left.  Today she woke up with right CVA pain blood in her urine as well as vomiting.  No fever, chills, dysuria.   Review of Systems     Objective:   Physical Exam Alert and in no distress.  Not pale or clammy.  Abdominal exam is negative.  Urine dipstick positive for blood.       Assessment & Plan:  Right flank pain - Plan: CT ABDOMEN PELVIS W CONTRAST  Urine abnormality - Plan: POCT Urinalysis DIP (Proadvantage Device)  Right upper quadrant abdominal pain - Plan: CT ABDOMEN PELVIS W CONTRAST  History of renal stone - Plan: CT ABDOMEN PELVIS W CONTRAST  Hematuria, unspecified type - Plan: CT ABDOMEN PELVIS W CONTRAST

## 2017-09-27 NOTE — Addendum Note (Signed)
Addended by: Elyse Jarvis on: 09/27/2017 05:06 PM   Modules accepted: Orders

## 2017-09-28 ENCOUNTER — Ambulatory Visit
Admission: RE | Admit: 2017-09-28 | Discharge: 2017-09-28 | Disposition: A | Discharge status: Home / Self Care | Payer: Medicare Other | Source: Ambulatory Visit | Attending: Family Medicine | Admitting: Family Medicine

## 2017-09-28 DIAGNOSIS — N202 Calculus of kidney with calculus of ureter: Secondary | ICD-10-CM | POA: Diagnosis not present

## 2017-09-28 DIAGNOSIS — R319 Hematuria, unspecified: Secondary | ICD-10-CM

## 2017-09-29 ENCOUNTER — Inpatient Hospital Stay: Admission: RE | Admit: 2017-09-29 | Payer: Medicare Other | Source: Ambulatory Visit

## 2017-10-11 ENCOUNTER — Ambulatory Visit (INDEPENDENT_AMBULATORY_CARE_PROVIDER_SITE_OTHER): Payer: Medicare Other | Admitting: Family Medicine

## 2017-10-11 ENCOUNTER — Encounter: Payer: Self-pay | Admitting: Family Medicine

## 2017-10-11 VITALS — BP 150/98 | HR 70 | Temp 97.5°F | Wt 262.8 lb

## 2017-10-11 DIAGNOSIS — N200 Calculus of kidney: Secondary | ICD-10-CM

## 2017-10-11 MED ORDER — HYDROCODONE-ACETAMINOPHEN 5-325 MG PO TABS: 1.0000 | ORAL_TABLET | Freq: Four times a day (QID) | ORAL | 0 refills | Status: DC | PRN

## 2017-10-11 NOTE — Progress Notes (Signed)
   Subjective:    Patient ID: Barbara Cunningham, female    DOB: 30-Oct-1950, 67 y.o.   MRN: [id number]  HPI She is here for recheck.  She states her pain is still in the right flank area and has not moved.  It has been intermittent in nature.  She would like a refill on her medications.  She has had no fever, chills, abdominal pain.  Review of Systems     Objective:   Physical Exam Alert and in no distress.       Assessment & Plan:  Renal stone - Plan: HYDROcodone-acetaminophen (NORCO) 5-325 MG tablet I discussed the fact that she has a 5 mm stone in the usually pass.  Recommend she increase her fluids and if the pain remains constant, I will refer her to urology in roughly 1 week.  She was comfortable with that.

## 2017-10-25 ENCOUNTER — Telehealth: Payer: Self-pay | Admitting: Family Medicine

## 2017-10-25 DIAGNOSIS — F909 Attention-deficit hyperactivity disorder, unspecified type: Secondary | ICD-10-CM

## 2017-10-25 MED ORDER — AMPHETAMINE-DEXTROAMPHETAMINE 20 MG PO TABS: 20.0000 mg | ORAL_TABLET | Freq: Two times a day (BID) | ORAL | 0 refills | Status: DC

## 2017-10-25 NOTE — Telephone Encounter (Signed)
Pt called and is requesting a refill on on her adderall pt uses [facility] [address], [place] and pt can be reached at [phone]

## 2017-11-03 DIAGNOSIS — K740 Hepatic fibrosis: Secondary | ICD-10-CM | POA: Diagnosis not present

## 2017-11-08 ENCOUNTER — Ambulatory Visit (INDEPENDENT_AMBULATORY_CARE_PROVIDER_SITE_OTHER): Payer: Medicare Other | Admitting: Family Medicine

## 2017-11-08 ENCOUNTER — Encounter: Payer: Self-pay | Admitting: Family Medicine

## 2017-11-08 VITALS — BP 132/84 | HR 68 | Temp 97.6°F | Ht 66.0 in | Wt 259.2 lb

## 2017-11-08 DIAGNOSIS — J320 Chronic maxillary sinusitis: Secondary | ICD-10-CM | POA: Diagnosis not present

## 2017-11-08 MED ORDER — AMOXICILLIN-POT CLAVULANATE 875-125 MG PO TABS: 1.0000 | ORAL_TABLET | Freq: Two times a day (BID) | ORAL | 0 refills | Status: DC

## 2017-11-08 NOTE — Patient Instructions (Signed)
Take the full 2 weeks of the antibiotic and if not totally back to normal go ahead and refill it.  If he still have trouble after the second dose of the antibiotic call me

## 2017-11-08 NOTE — Progress Notes (Signed)
   Subjective:    Patient ID: Barbara Cunningham, female    DOB: 04-Apr-1951, 67 y.o.   MRN: [id number]  HPI She complains of a 41-month history of difficulty with right nasal drainage of purulent material as well as postnasal drainage mainly on the right.  She did have a dental implant around the time that this started but her dentures did not think it was related.  She had no fever, chills, cough, congestion or earache.   Review of Systems     Objective:   Physical Exam  Alert and in no distress.  Nasal mucosa is normal.  Some tenderness over right maxillary sinus.  I will treated for 2 weeks and give her a refill.  Tympanic membranes and canals are normal. Pharyngeal area is normal. Neck is supple without adenopathy or thyromegaly. Cardiac exam shows a regular sinus rhythm without murmurs or gallops.        Assessment & Plan:  Chronic maxillary sinusitis - Plan: amoxicillin-clavulanate (AUGMENTIN) 875-125 MG tablet  I will give her 2 weeks worth of the antibiotic and is still having difficulty she is to get it refilled.  Is still having difficulty she will call for possible CT scan.

## 2017-11-15 ENCOUNTER — Ambulatory Visit: Payer: Medicare Other | Admitting: Family Medicine

## 2018-01-05 ENCOUNTER — Telehealth: Payer: Self-pay | Admitting: Family Medicine

## 2018-01-05 DIAGNOSIS — F909 Attention-deficit hyperactivity disorder, unspecified type: Secondary | ICD-10-CM

## 2018-01-05 MED ORDER — AMPHETAMINE-DEXTROAMPHETAMINE 20 MG PO TABS: 20.0000 mg | ORAL_TABLET | Freq: Two times a day (BID) | ORAL | 0 refills | Status: DC

## 2018-01-05 NOTE — Telephone Encounter (Signed)
Pt called and is requesting a refill on her adderall pt would like it sent the [facility] [address] [place] pt can be reached at [phone]

## 2018-02-04 ENCOUNTER — Encounter: Payer: Self-pay | Admitting: Family Medicine

## 2018-02-04 ENCOUNTER — Ambulatory Visit (INDEPENDENT_AMBULATORY_CARE_PROVIDER_SITE_OTHER): Payer: Medicare Other | Admitting: Family Medicine

## 2018-02-04 VITALS — BP 130/80 | HR 60 | Temp 97.7°F | Wt 243.6 lb

## 2018-02-04 DIAGNOSIS — N281 Cyst of kidney, acquired: Secondary | ICD-10-CM | POA: Diagnosis not present

## 2018-02-04 DIAGNOSIS — N200 Calculus of kidney: Secondary | ICD-10-CM

## 2018-02-04 DIAGNOSIS — R109 Unspecified abdominal pain: Secondary | ICD-10-CM | POA: Diagnosis not present

## 2018-02-04 LAB — POCT URINALYSIS DIP (PROADVANTAGE DEVICE)
Bilirubin, UA: NEGATIVE
Blood, UA: NEGATIVE
Glucose, UA: NEGATIVE mg/dL
Ketones, POC UA: NEGATIVE mg/dL
LEUKOCYTES UA: NEGATIVE
NITRITE UA: NEGATIVE
PH UA: 6 (ref 5.0–8.0)
PROTEIN UA: NEGATIVE mg/dL
Specific Gravity, Urine: 1.03
Urobilinogen, Ur: NEGATIVE

## 2018-02-04 NOTE — Progress Notes (Signed)
   Subjective:    Patient ID: Barbara Cunningham, female    DOB: 11-11-1950, 67 y.o.   MRN: [id number]  HPI Chief Complaint  Patient presents with  . mid back pain    mid back pain-been going on for like 15 years. comes and goes for the last 5-6 weeks.    Here today with a history complaints of non radiating left flank pain that has been constant for the past 2 1/2 weeks. Pain is waxing and waning with intensity. Nothing aggravates or alleviates or pain. History of similar pain thought to be related to kidney stone. She has done nothing for the pain.   She has had several scans of her abdomen and pelvis in the past which showed bilateral renal stones as well as left renal cyst. She was referred to [facility]. States she did see them in 2017 but has not followed up.   Denies fever, chills, dizziness, chest pain, palpitations, shortness of breath, abdominal pain, N/V/D, urinary symptoms.   Reviewed allergies, medications, past medical, surgical, family, and social history.    Review of Systems Pertinent positives and negatives in the history of present illness.     Objective:   Physical Exam  Constitutional: She appears well-developed and well-nourished. No distress.  Cardiovascular: Normal rate, regular rhythm, normal heart sounds and intact distal pulses.  Pulmonary/Chest: Effort normal and breath sounds normal.  Abdominal: Soft. There is no tenderness. There is no rigidity, no guarding and no CVA tenderness.  Musculoskeletal:       Back:  TTP to a very small area over left flank. No rash, erythema, bruising or edema. Normal sensation and movement of back.    BP 130/80   Pulse 60   Temp 97.7 F (36.5 C) (Oral)   Wt 243 lb 9.6 oz (110.5 kg)   BMI 39.32 kg/m      Assessment & Plan:  Left flank pain - Plan: CANCELED: POCT Urinalysis DIP (Proadvantage Device)  Renal cyst  Renal calculus, bilateral - Plan: CANCELED: POCT Urinalysis DIP (Proadvantage Device)  She is  not in any distress. Vitals are WNL.  Urinalysis dipstick is negative.  Exam is not concerning for obstruction or infection.  Etiology does not appear to be related to a MSK etiology. Offered a toradol injection and she declines. She will take 2 Aleve twice daily and use heat or ice.  Recommend that she see her urologist in the next week or two. She agrees.

## 2018-02-04 NOTE — Patient Instructions (Signed)
Your urine is negative today for infection or blood.  Call and schedule to see your urologist as soon as possible.   Stay well hydrated, take Aleve twice daily with food.

## 2018-02-07 ENCOUNTER — Encounter: Payer: Self-pay | Admitting: Family Medicine

## 2018-02-07 ENCOUNTER — Ambulatory Visit (INDEPENDENT_AMBULATORY_CARE_PROVIDER_SITE_OTHER): Payer: Medicare Other | Admitting: Family Medicine

## 2018-02-07 VITALS — BP 130/78 | HR 62 | Temp 97.4°F | Wt 242.8 lb

## 2018-02-07 DIAGNOSIS — L03114 Cellulitis of left upper limb: Secondary | ICD-10-CM | POA: Diagnosis not present

## 2018-02-07 DIAGNOSIS — L239 Allergic contact dermatitis, unspecified cause: Secondary | ICD-10-CM | POA: Diagnosis not present

## 2018-02-07 MED ORDER — SULFAMETHOXAZOLE-TRIMETHOPRIM 800-160 MG PO TABS
1.0000 | ORAL_TABLET | Freq: Two times a day (BID) | ORAL | 0 refills | Status: DC
Start: 2018-02-07 — End: 2019-12-12

## 2018-02-07 MED ORDER — TRIAMCINOLONE ACETONIDE 0.1 % EX CREA: 1.0000 "application " | TOPICAL_CREAM | Freq: Two times a day (BID) | CUTANEOUS | 0 refills | Status: DC

## 2018-02-07 MED ORDER — PREDNISONE 10 MG (21) PO TBPK: ORAL_TABLET | Freq: Every day | ORAL | 0 refills | Status: DC

## 2018-02-07 NOTE — Progress Notes (Signed)
   Subjective:    Patient ID: Barbara Cunningham, female    DOB: [birth year], 67 y.o.   MRN: [id number]  HPI Chief Complaint  Patient presents with  . other    skin rash    She is here for complaints of a pruritic red rash on her left arm and neck from poison ivy or sumac. Rash has been worsening for the past 3 days. States rash is spreading and she is very uncomfortable. Denies fever, chills, N/V/D.  She has been using topical steroids without improvement.   History of questionable bruising to steroids but states last time she had steroids she did fine with them.   Reviewed allergies, medications, past medical, surgical, family, and social history.   Review of Systems Pertinent positives and negatives in the history of present illness.     Objective:   Physical Exam BP 130/78 (BP Location: Left Arm, Patient Position: Sitting)   Pulse 62   Temp (!) 97.4 F (36.3 C)   Wt 242 lb 12.8 oz (110.1 kg)   SpO2 97%   BMI 39.19 kg/m   Left arm with several linear vesicular and erythematous based pruritic rash. Arm is warmer to touch than right and early cellulitis present. Rash on left anterior neck and right anterior upper arm with several red pruritic bumps. LUE is neurovascularly intact.       Assessment & Plan:  Allergic contact dermatitis, unspecified trigger - Plan: predniSONE (STERAPRED UNI-PAK 21 TAB) 10 MG (21) TBPK tablet, triamcinolone cream (KENALOG) 0.1 %  Cellulitis of left upper extremity - Plan: sulfamethoxazole-trimethoprim (BACTRIM DS,SEPTRA DS) 800-160 MG tablet  Prednisone prescribed, she has done well with steroids in the recent past.  Topical Kenalog for worse areas. Domeboro recommended for oozing areas. May take Benadryl for itching. Cool showers and compresses for itching.  Suspicious cellulitis on her left forearm. Bactrim prescribed.  She will follow up in 2 days.

## 2018-02-07 NOTE — Patient Instructions (Addendum)
Take the antibiotic and prednisone as prescribed.  Use the topical steroid as needed for the next week.  Use cool compresses and take cool showers or baths.  Heat will make the itching worse. Take Benadryl as needed to control itching.  Be aware this may be sedating.  You can try using DOMEBORO for the areas that are oozing.  This will be over-the-counter at your pharmacy.

## 2018-02-08 NOTE — Addendum Note (Signed)
Addended by: Minette Headland A on: 02/08/2018 07:45 AM   Modules accepted: Orders

## 2018-02-09 ENCOUNTER — Encounter: Payer: Self-pay | Admitting: Family Medicine

## 2018-02-09 ENCOUNTER — Ambulatory Visit (INDEPENDENT_AMBULATORY_CARE_PROVIDER_SITE_OTHER): Payer: Medicare Other | Admitting: Family Medicine

## 2018-02-09 VITALS — HR 61 | Temp 97.6°F

## 2018-02-09 DIAGNOSIS — L255 Unspecified contact dermatitis due to plants, except food: Secondary | ICD-10-CM

## 2018-02-09 DIAGNOSIS — Z09 Encounter for follow-up examination after completed treatment for conditions other than malignant neoplasm: Secondary | ICD-10-CM | POA: Diagnosis not present

## 2018-02-09 NOTE — Progress Notes (Signed)
   Subjective:    Patient ID: Barbara Cunningham, female    DOB: Nov 27, 1950, 67 y.o.   MRN: [id number]  HPI Chief Complaint  Patient presents with  . follow-up    follow-up on arm.    She is here to follow up on rash r/t allergic poison ivy reaction and cellulitis. Taking prednisone and Bactrim and reports improvement with symptoms and no side effects.  No fever, chills. Feels fine.    Review of Systems Pertinent positives and negatives in the history of present illness.     Objective:   Physical Exam Pulse 61   Temp 97.6 F (36.4 C) (Oral)   SpO2 98%   Left arm with improvement in erythema, warmth and swelling. Rash is now dry and resolving.       Assessment & Plan:  Dermatitis due to plants, including poison ivy, sumac, and oak  Follow up  Symptoms improving significantly. Continue steroid and antibiotic. Follow as needed.

## 2018-02-24 ENCOUNTER — Other Ambulatory Visit: Payer: Self-pay

## 2018-04-26 DIAGNOSIS — H35371 Puckering of macula, right eye: Secondary | ICD-10-CM | POA: Diagnosis not present

## 2018-04-26 DIAGNOSIS — H04123 Dry eye syndrome of bilateral lacrimal glands: Secondary | ICD-10-CM | POA: Diagnosis not present

## 2018-04-26 DIAGNOSIS — H2513 Age-related nuclear cataract, bilateral: Secondary | ICD-10-CM | POA: Diagnosis not present

## 2018-05-11 DIAGNOSIS — K740 Hepatic fibrosis: Secondary | ICD-10-CM | POA: Diagnosis not present

## 2018-05-13 ENCOUNTER — Other Ambulatory Visit: Payer: Self-pay | Admitting: Nurse Practitioner

## 2018-05-13 DIAGNOSIS — K7400 Hepatic fibrosis, unspecified: Secondary | ICD-10-CM

## 2018-05-18 ENCOUNTER — Ambulatory Visit
Admission: RE | Admit: 2018-05-18 | Discharge: 2018-05-18 | Disposition: A | Discharge status: Home / Self Care | Payer: Medicare Other | Source: Ambulatory Visit | Attending: Nurse Practitioner | Admitting: Nurse Practitioner

## 2018-05-18 DIAGNOSIS — K7400 Hepatic fibrosis, unspecified: Secondary | ICD-10-CM

## 2018-08-26 ENCOUNTER — Telehealth: Payer: Self-pay | Admitting: Family Medicine

## 2018-08-26 DIAGNOSIS — F909 Attention-deficit hyperactivity disorder, unspecified type: Secondary | ICD-10-CM

## 2018-08-26 NOTE — Telephone Encounter (Signed)
[facility] left message requesting new prescription for pts Adderall. Their number is [phone]

## 2018-08-27 MED ORDER — AMPHETAMINE-DEXTROAMPHETAMINE 20 MG PO TABS: 20.0000 mg | ORAL_TABLET | Freq: Two times a day (BID) | ORAL | 0 refills | Status: DC

## 2018-09-14 DIAGNOSIS — K740 Hepatic fibrosis: Secondary | ICD-10-CM | POA: Diagnosis not present

## 2018-09-14 DIAGNOSIS — K7469 Other cirrhosis of liver: Secondary | ICD-10-CM | POA: Diagnosis not present

## 2018-09-19 ENCOUNTER — Other Ambulatory Visit: Payer: Self-pay | Admitting: Nurse Practitioner

## 2018-09-19 DIAGNOSIS — K7469 Other cirrhosis of liver: Secondary | ICD-10-CM

## 2018-10-15 ENCOUNTER — Other Ambulatory Visit: Payer: Self-pay | Admitting: Family Medicine

## 2018-10-15 DIAGNOSIS — N200 Calculus of kidney: Secondary | ICD-10-CM

## 2018-10-15 MED ORDER — HYDROCODONE-ACETAMINOPHEN 5-325 MG PO TABS: 1.0000 | ORAL_TABLET | Freq: Four times a day (QID) | ORAL | 0 refills | Status: DC | PRN

## 2018-10-15 NOTE — Progress Notes (Signed)
Hydrocodone called in for renal stone pain

## 2018-11-10 ENCOUNTER — Other Ambulatory Visit: Payer: Medicare Other

## 2018-11-11 ENCOUNTER — Telehealth: Payer: Self-pay | Admitting: Family Medicine

## 2018-11-11 DIAGNOSIS — N200 Calculus of kidney: Secondary | ICD-10-CM

## 2018-11-11 DIAGNOSIS — F909 Attention-deficit hyperactivity disorder, unspecified type: Secondary | ICD-10-CM

## 2018-11-11 MED ORDER — AMPHETAMINE-DEXTROAMPHETAMINE 20 MG PO TABS: 20.0000 mg | ORAL_TABLET | Freq: Two times a day (BID) | ORAL | 0 refills | Status: DC

## 2018-11-11 MED ORDER — HYDROCODONE-ACETAMINOPHEN 5-325 MG PO TABS: 1.0000 | ORAL_TABLET | Freq: Four times a day (QID) | ORAL | 0 refills | Status: DC | PRN

## 2018-11-11 NOTE — Telephone Encounter (Signed)
Pt called and states that she needs refills on her adderall and her Norco pt needs them to go to the [facility] [address] [place]

## 2018-12-13 ENCOUNTER — Other Ambulatory Visit: Payer: Medicare Other

## 2019-01-04 ENCOUNTER — Ambulatory Visit
Admission: RE | Admit: 2019-01-04 | Discharge: 2019-01-04 | Disposition: A | Discharge status: Home / Self Care | Payer: Medicare Other | Source: Ambulatory Visit | Attending: Nurse Practitioner | Admitting: Nurse Practitioner

## 2019-01-04 DIAGNOSIS — K7469 Other cirrhosis of liver: Secondary | ICD-10-CM

## 2019-01-04 DIAGNOSIS — K8020 Calculus of gallbladder without cholecystitis without obstruction: Secondary | ICD-10-CM | POA: Diagnosis not present

## 2019-03-17 ENCOUNTER — Other Ambulatory Visit: Payer: Self-pay

## 2019-03-17 ENCOUNTER — Ambulatory Visit (INDEPENDENT_AMBULATORY_CARE_PROVIDER_SITE_OTHER): Payer: Medicare Other | Admitting: Family Medicine

## 2019-03-17 VITALS — BP 126/84 | HR 62 | Temp 98.0°F | Ht 65.0 in | Wt 241.4 lb

## 2019-03-17 DIAGNOSIS — F988 Other specified behavioral and emotional disorders with onset usually occurring in childhood and adolescence: Secondary | ICD-10-CM | POA: Diagnosis not present

## 2019-03-17 DIAGNOSIS — L239 Allergic contact dermatitis, unspecified cause: Secondary | ICD-10-CM

## 2019-03-17 DIAGNOSIS — L255 Unspecified contact dermatitis due to plants, except food: Secondary | ICD-10-CM

## 2019-03-17 MED ORDER — METHYLPREDNISOLONE SODIUM SUCC 125 MG IJ SOLR
125.0000 mg | Freq: Once | INTRAMUSCULAR | Status: AC
  Administered 2019-03-17: 09:00:00 125 mg via INTRAMUSCULAR

## 2019-03-17 NOTE — Progress Notes (Addendum)
   Subjective:    Patient ID: Barbara Cunningham, female    DOB: Aug 23, 1950, 68 y.o.   MRN: [id number]  HPI She is here for evaluation of a rash that she has on her hands.  She has been exposed to poison ivy and apparently gets a pretty severe case of it.  She did show a picture of previous exposure to this.  She has a busy weekend coming up with an old friend coming back to town and does not want to be bothered by this. She also is doing quite nicely on her ADD she uses the medication on an as-needed basis and gets roughly 8 hours of benefit out of it.  Has no side effects from it.   Review of Systems     Objective:   Physical Exam Alert and in no distress.  Multiple erythematous vesicles are seen on her hands on the dorsal surface.      Assessment & Plan:  Allergic contact dermatitis, unspecified trigger  Dermatitis due to plants, including poison ivy, sumac, and oak - Plan: methylPREDNISolone sodium succinate (SOLU-MEDROL) 125 mg/2 mL injection 125 mg  Attention deficit disorder, unspecified hyperactivity presence She will call me if she has further difficulty.  Also recommend cool compresses and Benadryl. I reviewed her medical record and strongly encouraged her to come back as she needs follow-up on multiple issues.

## 2019-03-30 ENCOUNTER — Other Ambulatory Visit: Payer: Self-pay

## 2019-03-30 DIAGNOSIS — R6889 Other general symptoms and signs: Secondary | ICD-10-CM | POA: Diagnosis not present

## 2019-03-30 DIAGNOSIS — Z20822 Contact with and (suspected) exposure to covid-19: Secondary | ICD-10-CM

## 2019-03-31 LAB — NOVEL CORONAVIRUS, NAA: SARS-CoV-2, NAA: NOT DETECTED

## 2019-04-05 ENCOUNTER — Telehealth: Payer: Self-pay | Admitting: Internal Medicine

## 2019-04-05 NOTE — Telephone Encounter (Signed)
Pt is schedule for flu shot and pneumonia but no notes of what pneumonia shot needs to be given? Please advise

## 2019-04-06 ENCOUNTER — Other Ambulatory Visit: Payer: Self-pay

## 2019-04-06 DIAGNOSIS — Z20822 Contact with and (suspected) exposure to covid-19: Secondary | ICD-10-CM

## 2019-04-06 NOTE — Telephone Encounter (Signed)
Noted in appt

## 2019-04-06 NOTE — Telephone Encounter (Signed)
Pneum 13 

## 2019-04-08 LAB — NOVEL CORONAVIRUS, NAA: SARS-CoV-2, NAA: NOT DETECTED

## 2019-04-14 ENCOUNTER — Other Ambulatory Visit: Payer: Self-pay

## 2019-04-14 ENCOUNTER — Other Ambulatory Visit (INDEPENDENT_AMBULATORY_CARE_PROVIDER_SITE_OTHER): Payer: Medicare Other

## 2019-04-14 DIAGNOSIS — Z23 Encounter for immunization: Secondary | ICD-10-CM

## 2019-04-19 ENCOUNTER — Encounter: Payer: Self-pay | Admitting: Family Medicine

## 2019-04-25 ENCOUNTER — Telehealth: Payer: Self-pay | Admitting: Internal Medicine

## 2019-04-25 ENCOUNTER — Other Ambulatory Visit: Payer: Self-pay | Admitting: Family Medicine

## 2019-04-25 DIAGNOSIS — N200 Calculus of kidney: Secondary | ICD-10-CM

## 2019-04-25 MED ORDER — HYDROCODONE-ACETAMINOPHEN 5-325 MG PO TABS: 1.0000 | ORAL_TABLET | Freq: Four times a day (QID) | ORAL | 0 refills | Status: DC | PRN

## 2019-04-25 NOTE — Telephone Encounter (Signed)
Have her come in and drop the urine off

## 2019-04-25 NOTE — Telephone Encounter (Signed)
Called pt to have her drop off a u/A . LVM for pt to call and make an appt for a nurse visit to do so. [place]

## 2019-04-25 NOTE — Telephone Encounter (Signed)
Pt states she is having left back pain for over a week and wants to come in and just drop off a urine to be checked for UTI or bladder infection. She says you let her do this when she has this pain as she did not want to schedule without asking you first. She also wants a refill on her hydrocodone for the back pain to [facility]

## 2019-04-27 ENCOUNTER — Other Ambulatory Visit (INDEPENDENT_AMBULATORY_CARE_PROVIDER_SITE_OTHER): Payer: Medicare Other

## 2019-04-27 ENCOUNTER — Other Ambulatory Visit: Payer: Self-pay

## 2019-04-27 DIAGNOSIS — H35371 Puckering of macula, right eye: Secondary | ICD-10-CM | POA: Diagnosis not present

## 2019-04-27 DIAGNOSIS — H2513 Age-related nuclear cataract, bilateral: Secondary | ICD-10-CM | POA: Diagnosis not present

## 2019-04-27 DIAGNOSIS — N200 Calculus of kidney: Secondary | ICD-10-CM

## 2019-04-27 DIAGNOSIS — H04123 Dry eye syndrome of bilateral lacrimal glands: Secondary | ICD-10-CM | POA: Diagnosis not present

## 2019-04-27 LAB — POCT URINALYSIS DIP (PROADVANTAGE DEVICE)
Bilirubin, UA: NEGATIVE
Blood, UA: NEGATIVE
Glucose, UA: NEGATIVE mg/dL
Ketones, POC UA: NEGATIVE mg/dL
Leukocytes, UA: NEGATIVE
Nitrite, UA: NEGATIVE
Protein Ur, POC: NEGATIVE mg/dL
Specific Gravity, Urine: 1.015
Urobilinogen, Ur: NEGATIVE
pH, UA: 7 (ref 5.0–8.0)

## 2019-05-03 DIAGNOSIS — K7469 Other cirrhosis of liver: Secondary | ICD-10-CM | POA: Diagnosis not present

## 2019-05-15 DIAGNOSIS — H33101 Unspecified retinoschisis, right eye: Secondary | ICD-10-CM | POA: Diagnosis not present

## 2019-05-15 DIAGNOSIS — H2511 Age-related nuclear cataract, right eye: Secondary | ICD-10-CM | POA: Diagnosis not present

## 2019-05-15 DIAGNOSIS — H2512 Age-related nuclear cataract, left eye: Secondary | ICD-10-CM | POA: Diagnosis not present

## 2019-05-15 DIAGNOSIS — H35371 Puckering of macula, right eye: Secondary | ICD-10-CM | POA: Diagnosis not present

## 2019-06-13 ENCOUNTER — Other Ambulatory Visit: Payer: Self-pay

## 2019-06-13 DIAGNOSIS — Z20828 Contact with and (suspected) exposure to other viral communicable diseases: Secondary | ICD-10-CM | POA: Diagnosis not present

## 2019-06-13 DIAGNOSIS — Z20822 Contact with and (suspected) exposure to covid-19: Secondary | ICD-10-CM

## 2019-06-15 LAB — NOVEL CORONAVIRUS, NAA: SARS-CoV-2, NAA: NOT DETECTED

## 2019-06-19 ENCOUNTER — Other Ambulatory Visit: Payer: Self-pay

## 2019-07-10 ENCOUNTER — Telehealth: Payer: Self-pay

## 2019-07-10 DIAGNOSIS — F909 Attention-deficit hyperactivity disorder, unspecified type: Secondary | ICD-10-CM

## 2019-07-10 DIAGNOSIS — N200 Calculus of kidney: Secondary | ICD-10-CM

## 2019-07-10 MED ORDER — HYDROCODONE-ACETAMINOPHEN 5-325 MG PO TABS: 1.0000 | ORAL_TABLET | Freq: Four times a day (QID) | ORAL | 0 refills | Status: DC | PRN

## 2019-07-10 MED ORDER — AMPHETAMINE-DEXTROAMPHETAMINE 20 MG PO TABS: 20.0000 mg | ORAL_TABLET | Freq: Two times a day (BID) | ORAL | 0 refills | Status: DC

## 2019-07-10 NOTE — Telephone Encounter (Signed)
Pt. Called LM stating that she needs a refill on her Adderall and her hydrocodone she is out of both, pt. Last apt. 03/17/19.

## 2019-07-10 NOTE — Telephone Encounter (Signed)
Done

## 2019-09-28 DIAGNOSIS — K7469 Other cirrhosis of liver: Secondary | ICD-10-CM | POA: Diagnosis not present

## 2019-09-29 ENCOUNTER — Other Ambulatory Visit: Payer: Self-pay | Admitting: Nurse Practitioner

## 2019-09-29 DIAGNOSIS — K7469 Other cirrhosis of liver: Secondary | ICD-10-CM

## 2019-10-24 ENCOUNTER — Telehealth: Payer: Self-pay

## 2019-10-24 DIAGNOSIS — H2513 Age-related nuclear cataract, bilateral: Secondary | ICD-10-CM | POA: Diagnosis not present

## 2019-10-24 DIAGNOSIS — H35371 Puckering of macula, right eye: Secondary | ICD-10-CM | POA: Diagnosis not present

## 2019-10-24 DIAGNOSIS — H43813 Vitreous degeneration, bilateral: Secondary | ICD-10-CM | POA: Diagnosis not present

## 2019-10-24 DIAGNOSIS — H04123 Dry eye syndrome of bilateral lacrimal glands: Secondary | ICD-10-CM | POA: Diagnosis not present

## 2019-10-24 DIAGNOSIS — N200 Calculus of kidney: Secondary | ICD-10-CM

## 2019-10-24 MED ORDER — HYDROCODONE-ACETAMINOPHEN 5-325 MG PO TABS: 1.0000 | ORAL_TABLET | Freq: Four times a day (QID) | ORAL | 0 refills | Status: DC | PRN

## 2019-10-24 NOTE — Telephone Encounter (Signed)
Pt. Called LM stating that she needs a refill on her Hydrocodone sent in to the [facility] pharmacy on [facility]. Pt. Last apt was 03/17/19.

## 2019-11-06 ENCOUNTER — Ambulatory Visit
Admission: RE | Admit: 2019-11-06 | Discharge: 2019-11-06 | Disposition: A | Discharge status: Home / Self Care | Payer: Medicare Other | Source: Ambulatory Visit | Attending: Nurse Practitioner | Admitting: Nurse Practitioner

## 2019-11-06 DIAGNOSIS — K7460 Unspecified cirrhosis of liver: Secondary | ICD-10-CM | POA: Diagnosis not present

## 2019-11-06 DIAGNOSIS — K8020 Calculus of gallbladder without cholecystitis without obstruction: Secondary | ICD-10-CM | POA: Diagnosis not present

## 2019-11-06 DIAGNOSIS — K7469 Other cirrhosis of liver: Secondary | ICD-10-CM

## 2019-11-13 ENCOUNTER — Encounter (INDEPENDENT_AMBULATORY_CARE_PROVIDER_SITE_OTHER): Payer: Medicare Other | Admitting: Ophthalmology

## 2019-11-16 DIAGNOSIS — H2511 Age-related nuclear cataract, right eye: Secondary | ICD-10-CM | POA: Diagnosis not present

## 2019-11-17 ENCOUNTER — Telehealth: Payer: Self-pay | Admitting: Family Medicine

## 2019-11-17 NOTE — Telephone Encounter (Signed)
Pt called and states that she has a UTI again and needs to come in for a UA and only wants to come in for a UA and have something sent in for it pt did not want to come for a office visit. States this is something that she has often, and wanted me to send a message, I informed you was out of the office, pt states she is suppose to have standing orders for UA when she gets UTI, pt can be reached at [phone] and please send back to someone else as I am no here this afternoon

## 2019-11-19 NOTE — Telephone Encounter (Signed)
Have her come in for a UA. Explain that I was off this weekend

## 2019-11-20 ENCOUNTER — Telehealth: Payer: Self-pay

## 2019-11-20 ENCOUNTER — Other Ambulatory Visit (INDEPENDENT_AMBULATORY_CARE_PROVIDER_SITE_OTHER): Payer: Medicare Other

## 2019-11-20 ENCOUNTER — Other Ambulatory Visit: Payer: Self-pay

## 2019-11-20 DIAGNOSIS — R399 Unspecified symptoms and signs involving the genitourinary system: Secondary | ICD-10-CM

## 2019-11-20 LAB — POCT URINALYSIS DIP (PROADVANTAGE DEVICE)
Bilirubin, UA: NEGATIVE
Blood, UA: NEGATIVE
Glucose, UA: NEGATIVE mg/dL
Ketones, POC UA: NEGATIVE mg/dL
Leukocytes, UA: NEGATIVE
Nitrite, UA: NEGATIVE
Specific Gravity, Urine: 1.03
Urobilinogen, Ur: 0.2
pH, UA: 5.5 (ref 5.0–8.0)

## 2019-11-20 NOTE — Telephone Encounter (Signed)
Pt was advised of U/A results . She will call back tomorrow for a appt. [place]

## 2019-11-20 NOTE — Telephone Encounter (Signed)
Pt will be in to day by three for a u/a

## 2019-11-27 DIAGNOSIS — H2512 Age-related nuclear cataract, left eye: Secondary | ICD-10-CM | POA: Diagnosis not present

## 2019-11-30 DIAGNOSIS — H2512 Age-related nuclear cataract, left eye: Secondary | ICD-10-CM | POA: Diagnosis not present

## 2019-12-11 ENCOUNTER — Telehealth: Payer: Self-pay | Admitting: Family Medicine

## 2019-12-11 ENCOUNTER — Telehealth: Payer: Self-pay

## 2019-12-11 DIAGNOSIS — N200 Calculus of kidney: Secondary | ICD-10-CM

## 2019-12-11 MED ORDER — HYDROCODONE-ACETAMINOPHEN 5-325 MG PO TABS: 1.0000 | ORAL_TABLET | Freq: Four times a day (QID) | ORAL | 0 refills | Status: DC | PRN

## 2019-12-11 NOTE — Telephone Encounter (Signed)
Pt called and made an appt for back pain for tomorrow. She is requesting a refill on pain medication today if possible. Pt uses [facility] [address].

## 2019-12-11 NOTE — Telephone Encounter (Signed)
Pt was advise that med was filled per Dr. Manon Hilding

## 2019-12-12 ENCOUNTER — Ambulatory Visit
Admission: RE | Admit: 2019-12-12 | Discharge: 2019-12-12 | Disposition: A | Discharge status: Home / Self Care | Payer: Medicare Other | Source: Ambulatory Visit | Attending: Family Medicine | Admitting: Family Medicine

## 2019-12-12 ENCOUNTER — Encounter: Payer: Self-pay | Admitting: Family Medicine

## 2019-12-12 ENCOUNTER — Other Ambulatory Visit: Payer: Self-pay

## 2019-12-12 ENCOUNTER — Ambulatory Visit (INDEPENDENT_AMBULATORY_CARE_PROVIDER_SITE_OTHER): Payer: Medicare Other | Admitting: Family Medicine

## 2019-12-12 VITALS — BP 154/100 | HR 65 | Temp 97.7°F | Wt 240.4 lb

## 2019-12-12 DIAGNOSIS — M549 Dorsalgia, unspecified: Secondary | ICD-10-CM | POA: Diagnosis not present

## 2019-12-12 DIAGNOSIS — M545 Low back pain: Secondary | ICD-10-CM | POA: Diagnosis not present

## 2019-12-12 NOTE — Progress Notes (Signed)
   Subjective:    Patient ID: Barbara Cunningham, female    DOB: 1950-11-05, 69 y.o.   MRN: [id number]  HPI She is here for evaluation of left-sided mid back pain.  She has a long history of this and dates it back to when she had surgery for removal of a kidney stone.  In the past she has been evaluated multiple times for UTI all which has been negative.  In the past this was treated with codeine as Tylenol and an NSAID usually would not enough to take care of this.  She does complain of pain with motion of her back and points to the left upper lumbar lower thoracic area.   Review of Systems     Objective:   Physical Exam Alert and complaining of back pain.  There is some palpable tenderness to the left paravertebral muscles in the upper lumbar lower thoracic area.  Pain on motion of the back. Urine dipstick was negative.      Assessment & Plan:  Mid back pain - Plan: DG Lumbar Spine Complete I discussed the x-ray and possibly seeing arthritic changes.  I explained that this is probably done to be musculoskeletal in nature and physical therapy/chiropractic/acupuncture might be good for this sort of thing.  She was comfortable with that.

## 2019-12-27 DIAGNOSIS — K7469 Other cirrhosis of liver: Secondary | ICD-10-CM | POA: Diagnosis not present

## 2020-01-10 ENCOUNTER — Ambulatory Visit (INDEPENDENT_AMBULATORY_CARE_PROVIDER_SITE_OTHER): Payer: Medicare Other | Admitting: Family Medicine

## 2020-01-10 ENCOUNTER — Encounter: Payer: Self-pay | Admitting: Family Medicine

## 2020-01-10 ENCOUNTER — Other Ambulatory Visit: Payer: Self-pay

## 2020-01-10 VITALS — BP 118/74 | HR 80 | Temp 97.3°F | Ht 65.0 in | Wt 241.6 lb

## 2020-01-10 DIAGNOSIS — L237 Allergic contact dermatitis due to plants, except food: Secondary | ICD-10-CM | POA: Diagnosis not present

## 2020-01-10 MED ORDER — METHYLPREDNISOLONE 4 MG PO TBPK: ORAL_TABLET | ORAL | 0 refills | Status: DC

## 2020-01-10 NOTE — Progress Notes (Signed)
Chief Complaint  Patient presents with  . Rash    trimming bushes last Friday and now has a rash-seems to get this every season. Very itching, not yet weeping.     Was in the yard doing yardwork on 6/11.  A couple of days later she noted bumps on her forearms, which is now spreading up to the upper arms. She denies any mouth/throat swelling, shortness of breath.    She has had similar problems in the past, and it usually spreads and gets severe, even including face swelling.  Trying to "nip it in the bud" this year.   PMH, PSH, SH reviewed--Hep C being treated, under control.  Gets yearly scans  Outpatient Encounter Medications as of 01/10/2020  Medication Sig  . amphetamine-dextroamphetamine (ADDERALL) 20 MG tablet Take 1 tablet (20 mg total) by mouth 2 (two) times daily.  Marland Kitchen HYDROcodone-acetaminophen (NORCO) 5-325 MG tablet Take 1 tablet by mouth every 6 (six) hours as needed for moderate pain.  . methylPREDNISolone (MEDROL DOSEPAK) 4 MG TBPK tablet Take as directed   No facility-administered encounter medications on file as of 01/10/2020.   (NOT taking medrol prior to today's visit)  No Known Allergies  ROS: no fever, chills, URI symptoms.  +rash per HPI.  +fatigue--asking about getting labs done (chart reviewed, hasn't had wellness visit, past due). Recently saw Dr. Redmond School with back pain, hasn't yet called for PT or chiro, asking about recommendations, unsure if referral needed. See HPI   PHYSICAL EXAM:  BP 118/74   Pulse 80   Temp (!) 97.3 F (36.3 C) (Temporal)   Ht 5\' 5"  (1.651 m)   Wt 241 lb 9.6 oz (109.6 kg)   BMI 40.20 kg/m   Well developed, pleasant female, in good spirits, in no distress HEENT: conjunctiva and sclera are clear, EOMI, wearing mask Lungs: clear bilaterally Skin: Forearms--some linear excoriations with papules and blisters.  Up the left forearm are many erythemaous papules. Remainder of skin is clear Neuro: alert and oriented, normal  gait Psych: normal mood, affect, hygiene and grooming   Poison ivy dermatitis - treat with steroid taper--contact us if recurs for cont'd lower dose steroid for longer, if needed. Risks/SE reviewed - Plan: methylPREDNISolone (MEDROL DOSEPAK) 4 MG TBPK tablet  Fatigue not addressed today--she should schedule AWV with Dr. Redmond School, and is due for labs  LBP, asking for PT recs.  Given some suggestions, those that don't require referral, though with Medicare might. She will look into, and contact Dr. Redmond School for referral if needed.    Take the steroid pack as directed. I'm going with the shorter course since it is being caught earlier than usual. It is possible, however, that you may have some recurrent lesions/rash shortly after completing the course.  If this is the case, contact us and we will extend the lower dose of steroid for a little longer

## 2020-01-10 NOTE — Patient Instructions (Signed)
Take the steroid pack as directed. I'm going with the shorter course since it is being caught earlier than usual. It is possible, however, that you may have some recurrent lesions/rash shortly after completing the course.  If this is the case, contact us and we will extend the lower dose of steroid for a little longer   Poison Ivy Dermatitis Poison ivy dermatitis is inflammation of the skin that is caused by chemicals in the leaves of the poison ivy plant. The skin reaction often involves redness, swelling, blisters, and extreme itching. What are the causes? This condition is caused by a chemical (urushiol) found in the sap of the poison ivy plant. This chemical is sticky and can be easily spread to people, animals, and objects. You can get poison ivy dermatitis by:  Having direct contact with a poison ivy plant.  Touching animals, other people, or objects that have come in contact with poison ivy and have the chemical on them. What increases the risk? This condition is more likely to develop in people who:  Are outdoors often in wooded or [place] areas.  Go outdoors without wearing protective clothing, such as closed shoes, long pants, and a long-sleeved shirt. What are the signs or symptoms? Symptoms of this condition include:  Redness of the skin.  Extreme itching.  A rash that often includes bumps and blisters. The rash usually appears 48 hours after exposure, if you have been exposed before. If this is the first time you have been exposed, the rash may not appear until a week after exposure.  Swelling. This may occur if the reaction is more severe. Symptoms usually last for 1-2 weeks. However, the first time you develop this condition, symptoms may last 3-4 weeks. How is this diagnosed? This condition may be diagnosed based on your symptoms and a physical exam. Your health care provider may also ask you about any recent outdoor activity. How is this treated? Treatment for this  condition will vary depending on how severe it is. Treatment may include:  Hydrocortisone cream or calamine lotion to relieve itching.  Oatmeal baths to soothe the skin.  Medicines, such as over-the-counter antihistamine tablets.  Oral steroid medicine, for more severe reactions. Follow these instructions at home: Medicines  Take or apply over-the-counter and prescription medicines only as told by your health care provider.  Use hydrocortisone cream or calamine lotion as needed to soothe the skin and relieve itching. General instructions  Do not scratch or rub your skin.  Apply a cold, wet cloth (cold compress) to the affected areas or take baths in cool water. This will help with itching. Avoid hot baths and showers.  Take oatmeal baths as needed. Use colloidal oatmeal. You can get this at your local pharmacy or grocery store. Follow the instructions on the packaging.  While you have the rash, wash clothes right after you wear them.  Keep all follow-up visits as told by your health care provider. This is important. How is this prevented?   Learn to identify the poison ivy plant and avoid contact with the plant. This plant can be recognized by the number of leaves. Generally, poison ivy has three leaves with flowering branches on a single stem. The leaves are typically glossy, and they have jagged edges that come to a point at the front.  If you have been exposed to poison ivy, thoroughly wash with soap and water right away. You have about 30 minutes to remove the plant resin before it will cause  the rash. Be sure to wash under your fingernails, because any plant resin there will continue to spread the rash.  When hiking or camping, wear clothes that will help you to avoid exposure on the skin. This includes long pants, a long-sleeved shirt, tall socks, and hiking boots. You can also apply preventive lotion to your skin to help limit exposure.  If you suspect that your clothes or  outdoor gear came in contact with poison ivy, rinse them off outside with a garden hose before you bring them inside your house.  When doing yard work or gardening, wear gloves, long sleeves, long pants, and boots. Wash your garden tools and gloves if they come in contact with poison ivy.  If you suspect that your pet has come into contact with poison ivy, wash him or her with pet shampoo and water. Make sure to wear gloves while washing your pet. Contact a health care provider if you have:  Open sores in the rash area.  More redness, swelling, or pain in the affected area.  Redness that spreads beyond the rash area.  Fluid, blood, or pus coming from the affected area.  A fever.  A rash over a large area of your body.  A rash on your eyes, mouth, or genitals.  A rash that does not improve after a few weeks. Get help right away if:  Your face swells or your eyes swell shut.  You have trouble breathing.  You have trouble swallowing. These symptoms may represent a serious problem that is an emergency. Do not wait to see if the symptoms will go away. Get medical help right away. Call your local emergency services (911 in the [place] Do not drive yourself to the hospital. Summary  Poison ivy dermatitis is inflammation of the skin that is caused by chemicals in the leaves of the poison ivy plant.  Symptoms of this condition include redness, itching, a rash, and swelling.  Do not scratch or rub your skin.  Take or apply over-the-counter and prescription medicines only as told by your health care provider. This information is not intended to replace advice given to you by your health care provider. Make sure you discuss any questions you have with your health care provider. Document Revised: 11/04/2018 Document Reviewed: 07/08/2018 Elsevier Patient Education  2020 [employer].

## 2020-03-14 ENCOUNTER — Encounter: Payer: Self-pay | Admitting: Family Medicine

## 2020-03-14 ENCOUNTER — Other Ambulatory Visit: Payer: Self-pay

## 2020-03-14 ENCOUNTER — Ambulatory Visit (INDEPENDENT_AMBULATORY_CARE_PROVIDER_SITE_OTHER): Payer: Medicare Other | Admitting: Family Medicine

## 2020-03-14 VITALS — BP 134/90 | HR 67 | Temp 97.3°F | Ht 64.0 in | Wt 240.2 lb

## 2020-03-14 DIAGNOSIS — Z0000 Encounter for general adult medical examination without abnormal findings: Secondary | ICD-10-CM

## 2020-03-14 DIAGNOSIS — K8020 Calculus of gallbladder without cholecystitis without obstruction: Secondary | ICD-10-CM | POA: Diagnosis not present

## 2020-03-14 DIAGNOSIS — Z1211 Encounter for screening for malignant neoplasm of colon: Secondary | ICD-10-CM | POA: Diagnosis not present

## 2020-03-14 DIAGNOSIS — Z13220 Encounter for screening for lipoid disorders: Secondary | ICD-10-CM | POA: Diagnosis not present

## 2020-03-14 DIAGNOSIS — Z87442 Personal history of urinary calculi: Secondary | ICD-10-CM

## 2020-03-14 DIAGNOSIS — R030 Elevated blood-pressure reading, without diagnosis of hypertension: Secondary | ICD-10-CM

## 2020-03-14 DIAGNOSIS — Z8619 Personal history of other infectious and parasitic diseases: Secondary | ICD-10-CM

## 2020-03-14 DIAGNOSIS — F988 Other specified behavioral and emotional disorders with onset usually occurring in childhood and adolescence: Secondary | ICD-10-CM

## 2020-03-14 DIAGNOSIS — N6459 Other signs and symptoms in breast: Secondary | ICD-10-CM | POA: Diagnosis not present

## 2020-03-14 DIAGNOSIS — Z7189 Other specified counseling: Secondary | ICD-10-CM | POA: Diagnosis not present

## 2020-03-14 DIAGNOSIS — E2839 Other primary ovarian failure: Secondary | ICD-10-CM

## 2020-03-14 NOTE — Patient Instructions (Addendum)
  Barbara Cunningham , Thank you for taking time to come for your Medicare Wellness Visit. I appreciate your ongoing commitment to your health goals. Please review the following plan we discussed and let me know if I can assist you in the future.   These are the goals we discussed: Set a dress size of 12.  Get a mammogram.  Set up to get the Tdap and Shingrix. This is a list of the screening recommended for you and due dates:  Health Maintenance  Topic Date Due  . COVID-19 Vaccine (1) Never done  . Mammogram  05/11/2013  . DEXA scan (bone density measurement)  Never done  . Colon Cancer Screening  05/10/2016  . Flu Shot  02/25/2020  . Pneumonia vaccines (2 of 2 - PPSV23) 04/13/2020  . Tetanus Vaccine  10/23/2020  .  Hepatitis C: One time screening is recommended by Center for Disease Control  (CDC) for  adults born from [birth year] through 1967.   Completed

## 2020-03-14 NOTE — Progress Notes (Signed)
Barbara Cunningham is a 69 y.o. female who presents for annual wellness visit and follow-up on chronic medical conditions.  She has a previous history of gallstones and renal stones but no difficulty recently.  She also has a previous history of hepatitis C and treatment.  She has remote history of difficulty with abnormal breast evaluation but unfortunately did not follow-up with this.  She does plan to given a appointment for mammogram.  She does have underlying ADD and does intermittently use medication for that.  She has been overweight for quite some time.  She states that she is lost 50 pounds over the last year.  She has not had a bone density scan done.  She has also avoided colon cancer screening.  Immunizations and Health Maintenance Immunization History  Administered Date(s) Administered   Fluad Quad(high Dose 65+) 04/14/2019   Pneumococcal Conjugate-13 04/14/2019   Pneumococcal Polysaccharide-23 10/24/2010   Tdap 10/24/2010   Zoster 10/24/2010   Health Maintenance Due  Topic Date Due   COVID-19 Vaccine (1) Never done   MAMMOGRAM  05/11/2013   DEXA SCAN  Never done   COLONOSCOPY  05/10/2016   INFLUENZA VACCINE  02/25/2020    Last Pap smear: aged out  Last mammogram: 05/12/11 Last colonoscopy: Unknown  Last DEXA: N/A Dentist: q six months Ophtho: q year Exercise: walking two hours a day  Other doctors caring for patient include: Dr. Katy Fitch eye, Dr. Collene Mares GI , Dr Beverley Fiedler   Advanced directives:no; info not given  Depression screen:  See questionnaire below.  Depression screen [facility] 2/9 03/14/2020 02/07/2018 07/13/2016  Decreased Interest 0 0 0  Down, Depressed, Hopeless 0 - 0  PHQ - 2 Score 0 0 0    Fall Risk Screen: see questionnaire below. Fall Risk  03/14/2020 06/19/2019 02/24/2018 02/07/2018 07/13/2016  Falls in the past year? 0 0 No No No  Comment - Emmi Telephone Survey: data to providers prior to load Emmi Telephone Survey: data to providers prior to load - -   Risk for fall due to : No Fall Risks - - - -    ADL screen:  See questionnaire below Functional Status Survey: Is the patient deaf or have difficulty hearing?: No Does the patient have difficulty seeing, even when wearing glasses/contacts?: No Does the patient have difficulty concentrating, remembering, or making decisions?: No Does the patient have difficulty walking or climbing stairs?: No Does the patient have difficulty dressing or bathing?: No Does the patient have difficulty doing errands alone such as visiting a doctor's office or shopping?: No   Review of Systems Constitutional: -, -unexpected weight change, -anorexia, -fatigue Allergy: -sneezing, -itching, -congestion Dermatology: denies changing moles, rash, lumps ENT: -runny nose, -ear pain, -sore throat,  Cardiology:  -chest pain, -palpitations, -orthopnea, Respiratory: -cough, -shortness of breath, -dyspnea on exertion, -wheezing,  Gastroenterology: -abdominal pain, -nausea, -vomiting, -diarrhea, -constipation, -dysphagia Hematology: -bleeding or bruising problems Musculoskeletal: -arthralgias, -myalgias, -joint swelling, -back pain, - Ophthalmology: -vision changes,  Urology: -dysuria, -difficulty urinating,  -urinary frequency, -urgency, incontinence Neurology: -, -numbness, , -memory loss, -falls, -dizziness    PHYSICAL EXAM:  BP 134/90    Pulse 67    Temp (!) 97.3 F (36.3 C)    Ht 5\' 4"  (1.626 m)    Wt 240 lb 3.2 oz (109 kg)    SpO2 97%    BMI 41.23 kg/m   General Appearance: Alert, cooperative, no distress, appears stated age Head: Normocephalic, without obvious abnormality, atraumatic Eyes: PERRL, conjunctiva/corneas clear, EOM's intact,  fundi benign Ears: Normal TM's and external ear canals Nose: Nares normal, mucosa normal, no drainage or sinus tenderness Throat: Lips, mucosa, and tongue normal; teeth and gums normal Neck: Supple, no lymphadenopathy;  thyroid:  no enlargement/tenderness/nodules; no  carotid bruit or JVD Lungs: Clear to auscultation bilaterally without wheezes, rales or ronchi; respirations unlabored Heart: Regular rate and rhythm, S1 and S2 normal, no murmur, rubor gallop Abdomen: Soft, non-tender, nondistended, normoactive bowel sounds,  no masses, no hepatosplenomegaly Extremities: No clubbing, cyanosis or edema Pulses: 2+ and symmetric all extremities Skin:  Skin color, texture, turgor normal, no rashes or lesions Lymph nodes: Cervical, supraclavicular, and axillary nodes normal Neurologic:  CNII-XII intact, normal strength, sensation and gait; reflexes 2+ and symmetric throughout Psych: Normal mood, affect, hygiene and grooming.  ASSESSMENT/PLAN: Routine general medical examination at a health care facility  Attention deficit disorder, unspecified hyperactivity presence  History of hepatitis C  Abnormal breast finding  Counseling on health promotion and disease prevention  Morbid obesity ([place]) - Plan: DG Bone Density, CBC with Differential/Platelet, Comprehensive metabolic panel, Lipid panel  Elevated blood pressure reading - Plan: CBC with Differential/Platelet  History of renal stone  Gall stones  Screening for colon cancer - Plan: Cologuard  Screening for lipid disorders - Plan: Lipid panel  Estrogen deficiency - Plan: DG Bone Density She will continue on her ADD med on an as-needed basis.  She will set herself up for getting a mammogram.  No intervention needed from the stones that she is having.  Discussed diet and exercise with her and setting the proper goal.  I recommended setting a proper dress size of 12 which is what her new goal will be but she is to maintain that rather than just reaching it.    Discussed at least 30 minutes of aerobic activity at least 5 days/week and weight-bearing exercise 2x/week; ; healthy diet, including goals of calcium and vitamin D intake recommend Tdap and Shingrix immunization recommendations discussed.   Colonoscopy recommendations reviewed.  Cologuard ordered   Medicare Attestation I have personally reviewed: The patient's medical and social history Their use of alcohol, tobacco or illicit drugs Their current medications and supplements The patient's functional ability including ADLs,fall risks, home safety risks, cognitive, and hearing and visual impairment Diet and physical activities Evidence for depression or mood disorders  The patient's weight, height, and BMI have been recorded in the chart.  I have made referrals, counseling, and provided education to the patient based on review of the above and I have provided the patient with a written personalized care plan for preventive services.     Jill Alexanders, MD   03/14/2020

## 2020-03-15 LAB — CBC WITH DIFFERENTIAL/PLATELET
Basophils Absolute: 0 10*3/uL (ref 0.0–0.2)
Basos: 1 %
EOS (ABSOLUTE): 0.1 10*3/uL (ref 0.0–0.4)
Eos: 2 %
Hematocrit: 43.2 % (ref 34.0–46.6)
Hemoglobin: 14.8 g/dL (ref 11.1–15.9)
Immature Grans (Abs): 0 10*3/uL (ref 0.0–0.1)
Immature Granulocytes: 0 %
Lymphocytes Absolute: 2 10*3/uL (ref 0.7–3.1)
Lymphs: 32 %
MCH: 31.2 pg (ref 26.6–33.0)
MCHC: 34.3 g/dL (ref 31.5–35.7)
MCV: 91 fL (ref 79–97)
Monocytes Absolute: 0.5 10*3/uL (ref 0.1–0.9)
Monocytes: 7 %
Neutrophils Absolute: 3.7 10*3/uL (ref 1.4–7.0)
Neutrophils: 58 %
Platelets: 219 10*3/uL (ref 150–450)
RBC: 4.75 x10E6/uL (ref 3.77–5.28)
RDW: 13 % (ref 11.7–15.4)
WBC: 6.4 10*3/uL (ref 3.4–10.8)

## 2020-03-15 LAB — COMPREHENSIVE METABOLIC PANEL
ALT: 13 IU/L (ref 0–32)
AST: 16 IU/L (ref 0–40)
Albumin/Globulin Ratio: 1.6 (ref 1.2–2.2)
Albumin: 4.4 g/dL (ref 3.8–4.8)
Alkaline Phosphatase: 108 IU/L (ref 48–121)
BUN/Creatinine Ratio: 22 (ref 12–28)
BUN: 18 mg/dL (ref 8–27)
Bilirubin Total: 0.5 mg/dL (ref 0.0–1.2)
CO2: 23 mmol/L (ref 20–29)
Calcium: 9.4 mg/dL (ref 8.7–10.3)
Chloride: 108 mmol/L — ABNORMAL HIGH (ref 96–106)
Creatinine, Ser: 0.81 mg/dL (ref 0.57–1.00)
GFR calc Af Amer: 85 mL/min/{1.73_m2} (ref >59)
GFR calc non Af Amer: 74 mL/min/{1.73_m2} (ref >59)
Globulin, Total: 2.7 g/dL (ref 1.5–4.5)
Glucose: 90 mg/dL (ref 65–99)
Potassium: 4.4 mmol/L (ref 3.5–5.2)
Sodium: 143 mmol/L (ref 134–144)
Total Protein: 7.1 g/dL (ref 6.0–8.5)

## 2020-03-15 LAB — LIPID PANEL
Chol/HDL Ratio: 3.4 ratio (ref 0.0–4.4)
Cholesterol, Total: 187 mg/dL (ref 100–199)
HDL: 55 mg/dL (ref >39)
LDL Chol Calc (NIH): 120 mg/dL — ABNORMAL HIGH (ref 0–99)
Triglycerides: 64 mg/dL (ref 0–149)
VLDL Cholesterol Cal: 12 mg/dL (ref 5–40)

## 2020-03-18 DIAGNOSIS — Z20822 Contact with and (suspected) exposure to covid-19: Secondary | ICD-10-CM | POA: Diagnosis not present

## 2020-03-19 ENCOUNTER — Telehealth: Payer: Self-pay | Admitting: Family Medicine

## 2020-03-19 NOTE — Telephone Encounter (Signed)
Requested records received from [facility]. Most recent Colonoscopy dated 05/10/2006 received

## 2020-03-21 ENCOUNTER — Encounter: Payer: Self-pay | Admitting: Family Medicine

## 2020-03-22 ENCOUNTER — Encounter: Payer: Self-pay | Admitting: Family Medicine

## 2020-04-03 ENCOUNTER — Other Ambulatory Visit: Payer: Self-pay | Admitting: Nurse Practitioner

## 2020-04-03 DIAGNOSIS — K7469 Other cirrhosis of liver: Secondary | ICD-10-CM | POA: Diagnosis not present

## 2020-04-10 ENCOUNTER — Ambulatory Visit
Admission: RE | Admit: 2020-04-10 | Discharge: 2020-04-10 | Disposition: A | Discharge status: Home / Self Care | Payer: Medicare Other | Source: Ambulatory Visit | Attending: Nurse Practitioner | Admitting: Nurse Practitioner

## 2020-04-10 DIAGNOSIS — K7460 Unspecified cirrhosis of liver: Secondary | ICD-10-CM | POA: Diagnosis not present

## 2020-04-10 DIAGNOSIS — K7469 Other cirrhosis of liver: Secondary | ICD-10-CM

## 2020-04-17 ENCOUNTER — Ambulatory Visit (INDEPENDENT_AMBULATORY_CARE_PROVIDER_SITE_OTHER): Payer: Medicare Other | Admitting: Family Medicine

## 2020-04-17 ENCOUNTER — Other Ambulatory Visit: Payer: Self-pay

## 2020-04-17 ENCOUNTER — Encounter: Payer: Self-pay | Admitting: Family Medicine

## 2020-04-17 VITALS — BP 136/86 | HR 62 | Temp 96.5°F | Wt 239.6 lb

## 2020-04-17 DIAGNOSIS — L237 Allergic contact dermatitis due to plants, except food: Secondary | ICD-10-CM

## 2020-04-17 MED ORDER — METHYLPREDNISOLONE SODIUM SUCC 125 MG IJ SOLR
125.0000 mg | Freq: Once | INTRAMUSCULAR | Status: AC
  Administered 2020-04-17: 125 mg via INTRAMUSCULAR

## 2020-04-17 NOTE — Addendum Note (Signed)
Addended by: Minette Headland A on: 04/17/2020 08:54 AM   Modules accepted: Orders

## 2020-04-17 NOTE — Progress Notes (Signed)
   Subjective:    Patient ID: Barbara Cunningham, female    DOB: 03-31-1951, 69 y.o.   MRN: [id number]  HPI She was exposed to poison ivy while doing some yard work about 3 days ago and now has it mainly on her forearms.  She has had difficulty with this in the past and would like steroids.   Review of Systems     Objective:   Physical Exam Alert and in no distress.  Exam of both medial forearms does show multiple linear erythema but no vesicles at the present time.       Assessment & Plan:  Poison ivy dermatitis She will be given a steroid injection.  Recommend cool compresses to the area as well as using cortisone cream.

## 2020-04-20 DIAGNOSIS — Z23 Encounter for immunization: Secondary | ICD-10-CM | POA: Diagnosis not present

## 2020-05-08 DIAGNOSIS — Z03818 Encounter for observation for suspected exposure to other biological agents ruled out: Secondary | ICD-10-CM | POA: Diagnosis not present

## 2020-05-28 ENCOUNTER — Telehealth: Payer: Self-pay | Admitting: Family Medicine

## 2020-05-28 NOTE — Telephone Encounter (Signed)
Received fax from [facility] in response to a request for records. They states no mammo with them since 2012.

## 2020-06-03 ENCOUNTER — Encounter: Payer: Self-pay | Admitting: Family Medicine

## 2020-06-03 ENCOUNTER — Ambulatory Visit (INDEPENDENT_AMBULATORY_CARE_PROVIDER_SITE_OTHER): Payer: Medicare Other | Admitting: Family Medicine

## 2020-06-03 ENCOUNTER — Other Ambulatory Visit: Payer: Self-pay

## 2020-06-03 VITALS — BP 144/90 | HR 66 | Temp 97.3°F | Wt 238.4 lb

## 2020-06-03 DIAGNOSIS — Z23 Encounter for immunization: Secondary | ICD-10-CM | POA: Diagnosis not present

## 2020-06-03 DIAGNOSIS — T148XXA Other injury of unspecified body region, initial encounter: Secondary | ICD-10-CM | POA: Diagnosis not present

## 2020-06-03 NOTE — Progress Notes (Signed)
   Subjective:    Patient ID: Barbara Cunningham, female    DOB: 12/31/1950, 69 y.o.   MRN: [id number]  HPI She sustained an injury to her left shin area about 3 weeks ago when she ran into a metal object.  Since then the swelling has continued.   Review of Systems     Objective:   Physical Exam Exam of the left shin does show swelling that is nontender and not erythematous.       Assessment & Plan:  Bone bruise  Need for influenza vaccination - Plan: Flu Vaccine QUAD High Dose(Fluad)  I explained that this is not unusual in that area and the healing process for this is very very slow.

## 2020-07-08 ENCOUNTER — Other Ambulatory Visit: Payer: Self-pay

## 2020-07-08 ENCOUNTER — Ambulatory Visit
Admission: RE | Admit: 2020-07-08 | Discharge: 2020-07-08 | Disposition: A | Discharge status: Home / Self Care | Payer: Medicare Other | Source: Ambulatory Visit | Attending: Family Medicine | Admitting: Family Medicine

## 2020-07-08 DIAGNOSIS — M85852 Other specified disorders of bone density and structure, left thigh: Secondary | ICD-10-CM | POA: Diagnosis not present

## 2020-07-08 DIAGNOSIS — Z780 Asymptomatic menopausal state: Secondary | ICD-10-CM | POA: Diagnosis not present

## 2020-07-08 DIAGNOSIS — E2839 Other primary ovarian failure: Secondary | ICD-10-CM

## 2020-07-09 DIAGNOSIS — H43813 Vitreous degeneration, bilateral: Secondary | ICD-10-CM | POA: Diagnosis not present

## 2020-07-09 DIAGNOSIS — H35371 Puckering of macula, right eye: Secondary | ICD-10-CM | POA: Diagnosis not present

## 2020-07-09 DIAGNOSIS — H04123 Dry eye syndrome of bilateral lacrimal glands: Secondary | ICD-10-CM | POA: Diagnosis not present

## 2020-07-09 DIAGNOSIS — Z961 Presence of intraocular lens: Secondary | ICD-10-CM | POA: Diagnosis not present

## 2020-07-31 DIAGNOSIS — Z03818 Encounter for observation for suspected exposure to other biological agents ruled out: Secondary | ICD-10-CM | POA: Diagnosis not present

## 2020-08-23 ENCOUNTER — Telehealth: Payer: Self-pay | Admitting: Family Medicine

## 2020-08-23 DIAGNOSIS — F909 Attention-deficit hyperactivity disorder, unspecified type: Secondary | ICD-10-CM

## 2020-08-23 MED ORDER — AMPHETAMINE-DEXTROAMPHETAMINE 20 MG PO TABS: 20.0000 mg | ORAL_TABLET | Freq: Two times a day (BID) | ORAL | 0 refills | Status: DC

## 2020-08-23 NOTE — Telephone Encounter (Signed)
Pt left voice mail requesting Adderall refill to be sent to [facility] she has called previously, don't see that rx has been sent in  Pt requesting to be notified when sent in

## 2020-08-23 NOTE — Telephone Encounter (Signed)
I just called it in 

## 2020-08-29 ENCOUNTER — Telehealth: Payer: Self-pay | Admitting: Family Medicine

## 2020-08-29 DIAGNOSIS — N200 Calculus of kidney: Secondary | ICD-10-CM

## 2020-08-29 MED ORDER — HYDROCODONE-ACETAMINOPHEN 5-325 MG PO TABS: 1.0000 | ORAL_TABLET | Freq: Four times a day (QID) | ORAL | 0 refills | Status: DC | PRN

## 2020-08-29 NOTE — Telephone Encounter (Signed)
Pt called for refills of Norco. She states she takes this medication occasionally. Please send to [facility] on [facility]. Pt can be reached at [phone].

## 2020-10-02 ENCOUNTER — Other Ambulatory Visit: Payer: Self-pay | Admitting: Nurse Practitioner

## 2020-10-02 DIAGNOSIS — K7469 Other cirrhosis of liver: Secondary | ICD-10-CM

## 2020-10-14 DIAGNOSIS — M47819 Spondylosis without myelopathy or radiculopathy, site unspecified: Secondary | ICD-10-CM | POA: Diagnosis not present

## 2020-10-14 DIAGNOSIS — M419 Scoliosis, unspecified: Secondary | ICD-10-CM | POA: Diagnosis not present

## 2020-10-14 DIAGNOSIS — M519 Unspecified thoracic, thoracolumbar and lumbosacral intervertebral disc disorder: Secondary | ICD-10-CM | POA: Diagnosis not present

## 2020-10-23 ENCOUNTER — Ambulatory Visit
Admission: RE | Admit: 2020-10-23 | Discharge: 2020-10-23 | Disposition: A | Discharge status: Home / Self Care | Payer: Medicare Other | Source: Ambulatory Visit | Attending: Nurse Practitioner | Admitting: Nurse Practitioner

## 2020-10-23 DIAGNOSIS — K7469 Other cirrhosis of liver: Secondary | ICD-10-CM

## 2020-10-23 DIAGNOSIS — K7460 Unspecified cirrhosis of liver: Secondary | ICD-10-CM | POA: Diagnosis not present

## 2020-10-31 ENCOUNTER — Other Ambulatory Visit: Payer: Self-pay

## 2020-10-31 ENCOUNTER — Telehealth (INDEPENDENT_AMBULATORY_CARE_PROVIDER_SITE_OTHER): Payer: Medicare Other | Admitting: Family Medicine

## 2020-10-31 VITALS — Ht 65.0 in | Wt 232.0 lb

## 2020-10-31 DIAGNOSIS — Z8619 Personal history of other infectious and parasitic diseases: Secondary | ICD-10-CM

## 2020-10-31 DIAGNOSIS — I10 Essential (primary) hypertension: Secondary | ICD-10-CM | POA: Diagnosis not present

## 2020-10-31 DIAGNOSIS — K8020 Calculus of gallbladder without cholecystitis without obstruction: Secondary | ICD-10-CM

## 2020-10-31 DIAGNOSIS — K7469 Other cirrhosis of liver: Secondary | ICD-10-CM | POA: Diagnosis not present

## 2020-10-31 DIAGNOSIS — K7460 Unspecified cirrhosis of liver: Secondary | ICD-10-CM | POA: Insufficient documentation

## 2020-10-31 MED ORDER — LOSARTAN POTASSIUM 50 MG PO TABS: 50.0000 mg | ORAL_TABLET | Freq: Every day | ORAL | 3 refills | Status: DC

## 2020-10-31 NOTE — Progress Notes (Signed)
   Subjective:    Patient ID: Barbara Cunningham, female    DOB: [birth year], 70 y.o.   MRN: [id number]  HPI Documentation for virtual audio and video telecommunications through [place] encounter: The patient was located at home. 2 patient identifiers used.  The provider was located in the office. The patient did consent to this visit and is aware of possible charges through their insurance for this visit. The other persons participating in this telemedicine service were none. Time spent on call was 10 minutes and in review of previous records >20 minutes total for counseling and coordination of care. This virtual service is not related to other E/M service within previous 7 days. She recently had an ultrasound for further evaluation of her previous history of hepatitis C and subsequent cirrhosis.  It did show gallstones.  Presently she is having no right upper quadrant pain, no difficulty with any eating.  Review of the note from the transplant service indicates need for better control of her blood pressure.  The blood pressure readings have been evaluated.  Review of Systems     Objective:   Physical Exam Alert and in no distress otherwise not examined.  Blood pressure readings were reviewed.       Assessment & Plan:  Primary hypertension - Plan: losartan (COZAAR) 50 MG tablet  History of hepatitis C  Other cirrhosis of liver (HCC)  Calculus of gallbladder without cholecystitis without obstruction  Morbid obesity ([place]) I discussed the gallstones with her and potential treatment.  Explained that since she is really not having much trouble with them, no intervention is really needed at this point.  Discussed greasy foods causing symptoms and subsequent need for further evaluation and treatment.  She was comfortable with that. She will continue to be followed by the transplant service. Discussed the blood pressure with her and I will place her on losartan.  She has made some dietary  changes and apparently has lost a significant amount of weight.  I encouraged her to continue with this but will place her on losartan.  Recheck here in about 1 month.  She was comfortable with that.

## 2020-11-14 ENCOUNTER — Telehealth: Payer: Self-pay

## 2020-11-14 DIAGNOSIS — N200 Calculus of kidney: Secondary | ICD-10-CM

## 2020-11-14 MED ORDER — HYDROCODONE-ACETAMINOPHEN 5-325 MG PO TABS: 1.0000 | ORAL_TABLET | Freq: Four times a day (QID) | ORAL | 0 refills | Status: DC | PRN

## 2020-11-14 NOTE — Telephone Encounter (Signed)
Pt. Requesting refill on Hydrocodone to [facility] [address]. Last 10/31/20.

## 2020-12-27 ENCOUNTER — Encounter: Payer: Self-pay | Admitting: Family Medicine

## 2020-12-27 ENCOUNTER — Other Ambulatory Visit: Payer: Self-pay

## 2020-12-27 ENCOUNTER — Ambulatory Visit (INDEPENDENT_AMBULATORY_CARE_PROVIDER_SITE_OTHER): Payer: Medicare Other | Admitting: Family Medicine

## 2020-12-27 VITALS — BP 120/70 | HR 61 | Temp 97.3°F | Wt 233.8 lb

## 2020-12-27 DIAGNOSIS — L237 Allergic contact dermatitis due to plants, except food: Secondary | ICD-10-CM

## 2020-12-27 MED ORDER — PREDNISONE 10 MG (21) PO TBPK: ORAL_TABLET | Freq: Every day | ORAL | 0 refills | Status: DC

## 2020-12-27 MED ORDER — TRIAMCINOLONE ACETONIDE 0.1 % EX CREA: 1.0000 "application " | TOPICAL_CREAM | Freq: Two times a day (BID) | CUTANEOUS | 0 refills | Status: DC

## 2020-12-27 NOTE — Patient Instructions (Addendum)
The next time you come in contact with poison ivy plants, I recommend using Dawn dishwashing detergent to the area. You can use over-the-counter Domeboro solution to dry up the areas. I also recommend using triamcinolone steroid cream which I prescribed.  I am prescribing you oral steroids only to use in case of significant worsening and spreading of the rash. Please call or return if he noticed any sign of infection.    Poison Ivy Dermatitis Poison ivy dermatitis is redness and soreness of the skin caused by chemicals in the leaves of the poison ivy plant. You may have very bad itching, swelling, a rash, and blisters. What are the causes?  Touching a poison ivy plant.  Touching something that has the chemical on it. This may include animals or objects that have come in contact with the plant. What increases the risk?  Going outdoors often in wooded or [place] areas.  Going outdoors without wearing protective clothing, such as closed shoes, long pants, and a long-sleeved shirt. What are the signs or symptoms?  Skin redness.  Very bad itching.  A rash that often includes bumps and blisters. ? The rash usually appears 48 hours after exposure, if you have been exposed before. ? If this is the first time you have been exposed, the rash may not appear until a week after exposure.  Swelling. This may occur if the reaction is very bad. Symptoms usually last for 1-2 weeks. The first time you develop this condition, symptoms may last 3-4 weeks.   How is this treated? This condition may be treated with:  Hydrocortisone cream or calamine lotion to relieve itching.  Oatmeal baths to soothe the skin.  Medicines, such as over-the-counter antihistamine tablets.  Oral steroid medicine for more severe reactions. Follow these instructions at home: Medicines  Take or apply over-the-counter and prescription medicines only as told by your doctor.  Use hydrocortisone cream or calamine lotion  as needed to help with itching. General instructions  Do not scratch or rub your skin.  Put a cold, wet cloth (cold compress) on the affected areas or take baths in cool water. This will help with itching.  Avoid hot baths and showers.  Take oatmeal baths as needed. Use colloidal oatmeal. You can get this at a pharmacy or grocery store. Follow the instructions on the package.  While you have the rash, wash your clothes right after you wear them.  Keep all follow-up visits as told by your health care provider. This is important. How is this prevented?  Know what poison ivy looks like, so you can avoid it. ? This plant has three leaves with flowering branches on a single stem. ? The leaves are glossy. ? The leaves have uneven edges that come to a point at the front.  If you touch poison ivy, wash your skin with soap and water right away. Be sure to wash under your fingernails.  When hiking or camping, wear long pants, a long-sleeved shirt, tall socks, and hiking boots. You can also use a lotion on your skin that helps to prevent contact with poison ivy.  If you think that your clothes or outdoor gear came in contact with poison ivy, rinse them off with a garden hose before you bring them inside your house.  When doing yard work or gardening, wear gloves, long sleeves, long pants, and boots. Wash your garden tools and gloves if they come in contact with poison ivy.  If you think that your pet has  come into contact with poison ivy, wash him or her with pet shampoo and water. Make sure to wear gloves while washing your pet.   Contact a doctor if:  You have open sores in the rash area.  You have more redness, swelling, or pain in the rash area.  You have redness that spreads beyond the rash area.  You have fluid, blood, or pus coming from the rash area.  You have a fever.  You have a rash over a large area of your body.  You have a rash on your eyes, mouth, or genitals.  Your  rash does not get better after a few weeks. Get help right away if:  Your face swells or your eyes swell shut.  You have trouble breathing.  You have trouble swallowing. These symptoms may be an emergency. Do not wait to see if the symptoms will go away. Get medical help right away. Call your local emergency services (911 in the [place] Do not drive yourself to the hospital. Summary  Poison ivy dermatitis is redness and soreness of the skin caused by chemicals in the leaves of the poison ivy plant.  You may have skin redness, very bad itching, swelling, and a rash.  Do not scratch or rub your skin.  Take or apply over-the-counter and prescription medicines only as told by your doctor. This information is not intended to replace advice given to you by your health care provider. Make sure you discuss any questions you have with your health care provider. Document Revised: 11/04/2018 Document Reviewed: 07/08/2018 Elsevier Patient Education  2021 [employer].

## 2020-12-27 NOTE — Progress Notes (Signed)
   Subjective:    Patient ID: Barbara Cunningham, female    DOB: 02/02/1951, 70 y.o.   MRN: [id number]  HPI Chief Complaint  Patient presents with  . poision oak    Poison oak or Ivy since Sunday. On arms and left foot. She wants a shot before it gets worse.    Here with complaints of a pruritic rash on her right forearm and foot after coming in contact with poison ivy.  She states she has allergic reaction to poison ivy this time every year it seems.  States she usually waits until it has spread all over her body but this time she came in sooner to address it. States in the past she is always required a steroid injection.  Denies fever, chills, nausea, vomiting, arthralgias or myalgias.   Review of Systems Pertinent positives and negatives in the history of present illness.     Objective:   Physical Exam BP 120/70   Pulse 61   Temp (!) 97.3 F (36.3 C)   Wt 233 lb 12.8 oz (106.1 kg)   BMI 38.91 kg/m   Right forearm with several vesicles and papules, mild surrounding erythema without exudate or induration..  Left forearm with an abrasion.  Top of her left foot with red bumps and an abrasion.       Assessment & Plan:  Allergic dermatitis due to poison ivy - Plan: triamcinolone cream (KENALOG) 0.1 %, predniSONE (STERAPRED UNI-PAK 21 TAB) 10 MG (21) TBPK tablet  Discussed conservative treatment including triamcinolone topical, Domeboro solution and Benadryl as needed.  Discussed using cool compresses.  I will also prescribe her an oral steroid to hold off on for now but in case the rash spreads significantly over the weekend as she is concerned that it will do.  Discussed signs and symptoms of infection and when to contact us.

## 2021-01-16 ENCOUNTER — Telehealth: Payer: Self-pay

## 2021-01-16 DIAGNOSIS — F909 Attention-deficit hyperactivity disorder, unspecified type: Secondary | ICD-10-CM

## 2021-01-16 NOTE — Telephone Encounter (Signed)
Pt left message needs refill Adderall

## 2021-01-17 MED ORDER — AMPHETAMINE-DEXTROAMPHETAMINE 20 MG PO TABS: 20.0000 mg | ORAL_TABLET | Freq: Two times a day (BID) | ORAL | 0 refills | Status: DC

## 2021-03-04 ENCOUNTER — Ambulatory Visit (INDEPENDENT_AMBULATORY_CARE_PROVIDER_SITE_OTHER): Payer: Medicare Other | Admitting: Family Medicine

## 2021-03-04 ENCOUNTER — Other Ambulatory Visit: Payer: Self-pay

## 2021-03-04 ENCOUNTER — Telehealth: Payer: Self-pay

## 2021-03-04 VITALS — BP 138/90 | HR 61 | Temp 96.5°F | Wt 239.4 lb

## 2021-03-04 DIAGNOSIS — M25561 Pain in right knee: Secondary | ICD-10-CM | POA: Diagnosis not present

## 2021-03-04 NOTE — Telephone Encounter (Signed)
Called [facility] t# [phone] scheduled appt for right knee today at 3:30 with Dr. Maxie Better,  pt informed

## 2021-03-04 NOTE — Progress Notes (Signed)
   Subjective:    Patient ID: Barbara Cunningham, female    DOB: 1951-05-19, 70 y.o.   MRN: [id number]  HPI She states that several months ago when she was going down some steps she landed awkwardly and felt some lateral right knee pain.  Symptoms went away until about 2 days ago when she experienced excruciating intermittent right lateral knee pain with clicking.   Review of Systems     Objective:   Physical Exam Right knee exam does show a small effusion.  She is tender over the lateral joint line.  McMurray's testing was actually negative however with bending of the knee she did occasionally have excruciating pain.  Clicking sensation was noted.  Anterior drawer is negative.  Lateral collateral ligament intact.       Assessment & Plan:  Acute pain of right knee - Plan: Ambulatory referral to Orthopedic Surgery I explained that I think she might be have an lateral meniscal damage with possible loose body.  Explained she would need an x-ray and because she is having this much excruciating pain I will work to get her an appointment with orthopedics.  She is scheduled to see Dr. Tonita Cong later today.  Email message was sent to him.

## 2021-03-11 DIAGNOSIS — M25561 Pain in right knee: Secondary | ICD-10-CM | POA: Diagnosis not present

## 2021-03-18 ENCOUNTER — Other Ambulatory Visit: Payer: Self-pay

## 2021-03-18 ENCOUNTER — Encounter: Payer: Self-pay | Admitting: Family Medicine

## 2021-03-18 ENCOUNTER — Ambulatory Visit (INDEPENDENT_AMBULATORY_CARE_PROVIDER_SITE_OTHER): Payer: Medicare Other | Admitting: Family Medicine

## 2021-03-18 VITALS — BP 162/92 | HR 53 | Temp 96.8°F | Ht 66.0 in | Wt 240.6 lb

## 2021-03-18 DIAGNOSIS — N6459 Other signs and symptoms in breast: Secondary | ICD-10-CM | POA: Diagnosis not present

## 2021-03-18 DIAGNOSIS — Z8619 Personal history of other infectious and parasitic diseases: Secondary | ICD-10-CM

## 2021-03-18 DIAGNOSIS — Z1211 Encounter for screening for malignant neoplasm of colon: Secondary | ICD-10-CM

## 2021-03-18 DIAGNOSIS — K7460 Unspecified cirrhosis of liver: Secondary | ICD-10-CM | POA: Diagnosis not present

## 2021-03-18 DIAGNOSIS — K8020 Calculus of gallbladder without cholecystitis without obstruction: Secondary | ICD-10-CM | POA: Diagnosis not present

## 2021-03-18 DIAGNOSIS — Z87442 Personal history of urinary calculi: Secondary | ICD-10-CM | POA: Diagnosis not present

## 2021-03-18 DIAGNOSIS — M25561 Pain in right knee: Secondary | ICD-10-CM | POA: Diagnosis not present

## 2021-03-18 DIAGNOSIS — F988 Other specified behavioral and emotional disorders with onset usually occurring in childhood and adolescence: Secondary | ICD-10-CM | POA: Diagnosis not present

## 2021-03-18 DIAGNOSIS — Z7189 Other specified counseling: Secondary | ICD-10-CM | POA: Diagnosis not present

## 2021-03-18 DIAGNOSIS — Z0000 Encounter for general adult medical examination without abnormal findings: Secondary | ICD-10-CM | POA: Diagnosis not present

## 2021-03-18 NOTE — Progress Notes (Addendum)
Barbara Cunningham is a 70 y.o. female who presents for annual wellness visit,CPE and follow-up on chronic medical conditions.  She is currently being followed by Dr. Tonita Cong because of her right knee pain.  Apparently an MRI is being ordered.  She does have underlying ADD and is doing well on her present medication regimen.  She does have a history of hepatitis C and has had antiviral medication given.  She is still being followed by the hepatology department.  She does have a previous history of an abnormal mammogram but did not follow-up with biopsy on this.  She does need a follow-up mammogram.  He has a history of renal stones but none recently.  She states that she has been trying to lose weight but readily admits that she allows many other things to get in the way of taking better care of herself.  She does have a history of gallbladder disease but presently is having no symptoms from that and is not interested in pursuing that.  She was given losartan for her blood pressure but has not started it and was hoping to start to lose weight and see if that would help but is unable to do that.  She admits to readily usually taking care of other people in spite of knowing the need to take better care of herself.  Immunizations and Health Maintenance Immunization History  Administered Date(s) Administered   Fluad Quad(high Dose 65+) 04/14/2019, 06/03/2020   PFIZER(Purple Top)SARS-COV-2 Vaccination 09/08/2019, 10/03/2019   Pneumococcal Conjugate-13 04/14/2019   Pneumococcal Polysaccharide-23 10/24/2010   Tdap 10/24/2010   Zoster, Live 10/24/2010   Health Maintenance Due  Topic Date Due   Zoster Vaccines- Shingrix (1 of 2) Never done   MAMMOGRAM  05/11/2013   COLONOSCOPY (Pts 45-66yr Insurance coverage will need to be confirmed)  05/10/2016   COVID-19 Vaccine (3 - Booster for Pfizer series) 03/04/2020   TETANUS/TDAP  10/23/2020   INFLUENZA VACCINE  02/24/2021    Last Pap smear: aged out  Last  mammogram: 05/12/11 Last colonoscopy: 05/10/2006 Last DEXA: 07/07/20 Dentist: Q six months  Ophtho: Q year Exercise: N/A  Other doctors caring for patient include: Dr. GKaty Fitcheye, Dr. BKendall Flack Advanced directives: Does Patient Have a Medical Advance Directive?: No Would patient like information on creating a medical advance directive?: Yes (ED - Information included in AVS) Info given Depression screen:  See questionnaire below.  [facility] 03/18/2021 03/14/2020 02/07/2018 07/13/2016  Decreased Interest 0 0 0 0  Down, Depressed, Hopeless 0 0 - 0  PHQ - 2 Score 0 0 0 0    Fall Risk Screen: see questionnaire below. Fall Risk  03/18/2021 03/14/2020 06/19/2019 02/24/2018 02/07/2018  Falls in the past year? 0 0 0 No No  Comment - - Emmi Telephone Survey: data to providers prior to load E[facility] ResourcesTelephone Survey: data to providers prior to load -  Number falls in past yr: 0 - - - -  Injury with Fall? 0 - - - -  Risk for fall due to : No Fall Risks No Fall Risks - - -  Follow up Falls evaluation completed - - - -    ADL screen:  See questionnaire below Functional Status Survey: Is the patient deaf or have difficulty hearing?: No Does the patient have difficulty seeing, even when wearing glasses/contacts?: No Does the patient have difficulty concentrating, remembering, or making decisions?: No Does the patient have difficulty walking or climbing stairs?: Yes (knee pain) Does the  patient have difficulty dressing or bathing?: No Does the patient have difficulty doing errands alone such as visiting a doctor's office or shopping?: No   Review of Systems Constitutional: -, -unexpected weight change, -anorexia, -fatigue Allergy: -sneezing, -itching, -congestion Dermatology: denies changing moles, rash, lumps ENT: -runny nose, -ear pain, -sore throat,  Cardiology:  -chest pain, -palpitations, -orthopnea, Respiratory: -cough, -shortness of breath, -dyspnea on exertion, -wheezing,   Gastroenterology: -abdominal pain, -nausea, -vomiting, -diarrhea, -constipation, -dysphagia Hematology: -bleeding or bruising problems Musculoskeletal: -arthralgias, -myalgias, -joint swelling, -back pain, - Ophthalmology: -vision changes,  Urology: -dysuria, -difficulty urinating,  -urinary frequency, -urgency, incontinence Neurology: -, -numbness, , -memory loss, -falls, -dizziness    PHYSICAL EXAM: General Appearance: Alert, cooperative, no distress, appears stated age Head: Normocephalic, without obvious abnormality, atraumatic Eyes: PERRL, conjunctiva/corneas clear, EOM's intact, Ears: Normal TM's and external ear canals Nose: Nares normal, mucosa normal, no drainage or sinus tenderness Throat: Lips, mucosa, and tongue normal; teeth and gums normal Neck: Supple, no lymphadenopathy;  thyroid:  no enlargement/tenderness/nodules; no carotid bruit or JVD Lungs: Clear to auscultation bilaterally without wheezes, rales or ronchi; respirations unlabored Heart: Regular rate and rhythm, S1 and S2 normal, no murmur, rubor gallop Abdomen: Soft, non-tender, nondistended, normoactive bowel sounds,  no masses, no hepatosplenomegaly Skin:  Skin color, texture, turgor normal, no rashes or lesions Lymph nodes: Cervical, supraclavicular, and axillary nodes normal Neurologic:  CNII-XII intact, normal strength, sensation and gait; reflexes 2+ and symmetric throughout Psych: Normal mood, affect, hygiene and grooming.  ASSESSMENT/PLAN: Attention deficit disorder, unspecified hyperactivity presence  History of hepatitis C  Abnormal breast finding  Counseling on health promotion and disease prevention  History of renal stone  Morbid obesity ([place])  Calculus of gallbladder without cholecystitis without obstruction  Screening for colon cancer  Cirrhosis of liver without ascites, unspecified hepatic cirrhosis type ([place]) Discussed the mammogram and explained that we need to definitely follow-up  with this and if biopsy is needed to, go ahead and get it.  Cologuard ordered.  Encouraged her to go ahead and start taking the antihypertensive medication.  No intervention needed for the gallbladder since she is not having any difficulty.  I then discussed the need for her to take better care of herself and at least have her wants needs and desires on the same level as everybody else that she tends to put above her.  Discussed proper diet and cutting back on carbohydrates.   Discussed monthly self breast exams and yearly mammograms; at least 30 minutes of aerobic activity at least 5 days/week and weight-bearing exercise 2x/week; proper sunscreen use reviewed; healthy diet, including goals of calcium and vitamin D intake  regular seatbelt use; changing batteries in smoke detectors.  Immunization recommendations discussed.  Colonoscopy recommendations reviewed   Medicare Attestation I have personally reviewed: The patient's medical and social history Their use of alcohol, tobacco or illicit drugs Their current medications and supplements The patient's functional ability including ADLs,fall risks, home safety risks, cognitive, and hearing and visual impairment Diet and physical activities Evidence for depression or mood disorders  The patient's weight, height, and BMI have been recorded in the chart.  I have made referrals, counseling, and provided education to the patient based on review of the above and I have provided the patient with a written personalized care plan for preventive services.     Jill Alexanders, MD   03/18/2021

## 2021-03-19 ENCOUNTER — Telehealth: Payer: Self-pay

## 2021-03-19 DIAGNOSIS — N200 Calculus of kidney: Secondary | ICD-10-CM

## 2021-03-19 LAB — CBC WITH DIFFERENTIAL/PLATELET
Basophils Absolute: 0.1 10*3/uL (ref 0.0–0.2)
Basos: 1 %
EOS (ABSOLUTE): 0.2 10*3/uL (ref 0.0–0.4)
Eos: 4 %
Hematocrit: 42 % (ref 34.0–46.6)
Hemoglobin: 14.1 g/dL (ref 11.1–15.9)
Immature Grans (Abs): 0 10*3/uL (ref 0.0–0.1)
Immature Granulocytes: 0 %
Lymphocytes Absolute: 1.8 10*3/uL (ref 0.7–3.1)
Lymphs: 30 %
MCH: 30 pg (ref 26.6–33.0)
MCHC: 33.6 g/dL (ref 31.5–35.7)
MCV: 89 fL (ref 79–97)
Monocytes Absolute: 0.5 10*3/uL (ref 0.1–0.9)
Monocytes: 9 %
Neutrophils Absolute: 3.6 10*3/uL (ref 1.4–7.0)
Neutrophils: 56 %
Platelets: 225 10*3/uL (ref 150–450)
RBC: 4.7 x10E6/uL (ref 3.77–5.28)
RDW: 12.8 % (ref 11.7–15.4)
WBC: 6.2 10*3/uL (ref 3.4–10.8)

## 2021-03-19 LAB — COMPREHENSIVE METABOLIC PANEL
ALT: 17 IU/L (ref 0–32)
AST: 21 IU/L (ref 0–40)
Albumin/Globulin Ratio: 1.5 (ref 1.2–2.2)
Albumin: 3.9 g/dL (ref 3.7–4.7)
Alkaline Phosphatase: 104 IU/L (ref 44–121)
BUN/Creatinine Ratio: 25 (ref 12–28)
BUN: 21 mg/dL (ref 8–27)
Bilirubin Total: 0.3 mg/dL (ref 0.0–1.2)
CO2: 18 mmol/L — ABNORMAL LOW (ref 20–29)
Calcium: 9 mg/dL (ref 8.7–10.3)
Chloride: 107 mmol/L — ABNORMAL HIGH (ref 96–106)
Creatinine, Ser: 0.84 mg/dL (ref 0.57–1.00)
Globulin, Total: 2.6 g/dL (ref 1.5–4.5)
Glucose: 85 mg/dL (ref 65–99)
Potassium: 4.4 mmol/L (ref 3.5–5.2)
Sodium: 139 mmol/L (ref 134–144)
Total Protein: 6.5 g/dL (ref 6.0–8.5)
eGFR: 74 mL/min/{1.73_m2} (ref >59)

## 2021-03-19 LAB — LIPID PANEL
Chol/HDL Ratio: 3.3 ratio (ref 0.0–4.4)
Cholesterol, Total: 168 mg/dL (ref 100–199)
HDL: 51 mg/dL (ref >39)
LDL Chol Calc (NIH): 103 mg/dL — ABNORMAL HIGH (ref 0–99)
Triglycerides: 76 mg/dL (ref 0–149)
VLDL Cholesterol Cal: 14 mg/dL (ref 5–40)

## 2021-03-19 MED ORDER — HYDROCODONE-ACETAMINOPHEN 5-325 MG PO TABS: 1.0000 | ORAL_TABLET | Freq: Four times a day (QID) | ORAL | 0 refills | Status: DC | PRN

## 2021-03-19 NOTE — Telephone Encounter (Signed)
Pt. Called LM statin she was here yesterday and forgot to ask for a refill on her hydrocodone.

## 2021-04-03 ENCOUNTER — Other Ambulatory Visit: Payer: Self-pay | Admitting: Family Medicine

## 2021-04-03 DIAGNOSIS — Z1231 Encounter for screening mammogram for malignant neoplasm of breast: Secondary | ICD-10-CM

## 2021-04-09 ENCOUNTER — Other Ambulatory Visit: Payer: Self-pay | Admitting: Nurse Practitioner

## 2021-04-09 DIAGNOSIS — K7460 Unspecified cirrhosis of liver: Secondary | ICD-10-CM

## 2021-04-14 ENCOUNTER — Ambulatory Visit (INDEPENDENT_AMBULATORY_CARE_PROVIDER_SITE_OTHER): Payer: Medicare Other | Admitting: Family Medicine

## 2021-04-14 ENCOUNTER — Other Ambulatory Visit: Payer: Self-pay

## 2021-04-14 ENCOUNTER — Encounter: Payer: Self-pay | Admitting: Family Medicine

## 2021-04-14 VITALS — BP 138/88 | HR 71 | Temp 97.0°F | Wt 242.4 lb

## 2021-04-14 DIAGNOSIS — L309 Dermatitis, unspecified: Secondary | ICD-10-CM | POA: Diagnosis not present

## 2021-04-14 MED ORDER — TRIAMCINOLONE ACETONIDE 0.1 % EX CREA: 1.0000 "application " | TOPICAL_CREAM | Freq: Two times a day (BID) | CUTANEOUS | 0 refills | Status: AC

## 2021-04-14 NOTE — Patient Instructions (Signed)
Cool compresses to the area as well as Benadryl for the itching and use the triamcinolone sparingly twice per day

## 2021-04-14 NOTE — Progress Notes (Signed)
   Subjective:    Patient ID: Barbara Cunningham, female    DOB: [birth year], 70 y.o.   MRN: [id number]  HPI She has a 10-day history of a rash present on her cheeks and on the anterior chest.  No new soaps, detergents, colognes, exposure to chemicals.   Review of Systems     Objective:   Physical Exam Exam of the face does show erythema especially of the cheeks, left greater than right and also erythema in a V-shaped pattern on her upper chest and the line of a short period       Assessment & Plan:  Dermatitis - Plan: triamcinolone cream (KENALOG) 0.1 % I explained that this looks like it is skin exposure to the anterior chest and cheek area because there are no lesions anywhere else.  Recommend topical rather than systemic steroid due to possible side effects from the systemic.  Explained that not sure exactly the cause but it is more of a direct contact to this area.

## 2021-04-15 DIAGNOSIS — Z1211 Encounter for screening for malignant neoplasm of colon: Secondary | ICD-10-CM | POA: Diagnosis not present

## 2021-04-19 LAB — COLOGUARD: Cologuard: POSITIVE — AB

## 2021-04-21 ENCOUNTER — Other Ambulatory Visit: Payer: Self-pay

## 2021-04-21 DIAGNOSIS — Z1211 Encounter for screening for malignant neoplasm of colon: Secondary | ICD-10-CM

## 2021-04-28 ENCOUNTER — Ambulatory Visit
Admission: RE | Admit: 2021-04-28 | Discharge: 2021-04-28 | Disposition: A | Discharge status: Home / Self Care | Payer: Medicare Other | Source: Ambulatory Visit | Attending: Nurse Practitioner | Admitting: Nurse Practitioner

## 2021-04-28 DIAGNOSIS — K7460 Unspecified cirrhosis of liver: Secondary | ICD-10-CM

## 2021-04-28 DIAGNOSIS — K8020 Calculus of gallbladder without cholecystitis without obstruction: Secondary | ICD-10-CM | POA: Diagnosis not present

## 2021-04-29 DIAGNOSIS — R195 Other fecal abnormalities: Secondary | ICD-10-CM | POA: Diagnosis not present

## 2021-04-29 DIAGNOSIS — Z1211 Encounter for screening for malignant neoplasm of colon: Secondary | ICD-10-CM | POA: Diagnosis not present

## 2021-05-05 ENCOUNTER — Encounter: Payer: Self-pay | Admitting: Family Medicine

## 2021-05-09 ENCOUNTER — Other Ambulatory Visit: Payer: Self-pay

## 2021-05-09 ENCOUNTER — Ambulatory Visit
Admission: RE | Admit: 2021-05-09 | Discharge: 2021-05-09 | Disposition: A | Discharge status: Home / Self Care | Payer: Medicare Other | Source: Ambulatory Visit | Attending: Family Medicine | Admitting: Family Medicine

## 2021-05-09 DIAGNOSIS — Z1231 Encounter for screening mammogram for malignant neoplasm of breast: Secondary | ICD-10-CM

## 2021-05-12 DIAGNOSIS — D120 Benign neoplasm of cecum: Secondary | ICD-10-CM | POA: Diagnosis not present

## 2021-05-12 DIAGNOSIS — Z1211 Encounter for screening for malignant neoplasm of colon: Secondary | ICD-10-CM | POA: Diagnosis not present

## 2021-05-12 DIAGNOSIS — K635 Polyp of colon: Secondary | ICD-10-CM | POA: Diagnosis not present

## 2021-05-12 DIAGNOSIS — K633 Ulcer of intestine: Secondary | ICD-10-CM | POA: Diagnosis not present

## 2021-05-12 DIAGNOSIS — D123 Benign neoplasm of transverse colon: Secondary | ICD-10-CM | POA: Diagnosis not present

## 2021-05-12 DIAGNOSIS — K5000 Crohn's disease of small intestine without complications: Secondary | ICD-10-CM | POA: Diagnosis not present

## 2021-05-12 DIAGNOSIS — R195 Other fecal abnormalities: Secondary | ICD-10-CM | POA: Diagnosis not present

## 2021-05-12 LAB — HM COLONOSCOPY

## 2021-05-21 DIAGNOSIS — D126 Benign neoplasm of colon, unspecified: Secondary | ICD-10-CM | POA: Insufficient documentation

## 2021-05-21 DIAGNOSIS — K50018 Crohn's disease of small intestine with other complication: Secondary | ICD-10-CM | POA: Insufficient documentation

## 2021-06-23 ENCOUNTER — Telehealth: Payer: Self-pay

## 2021-06-23 DIAGNOSIS — N200 Calculus of kidney: Secondary | ICD-10-CM

## 2021-06-23 MED ORDER — HYDROCODONE-ACETAMINOPHEN 5-325 MG PO TABS: 1.0000 | ORAL_TABLET | Freq: Four times a day (QID) | ORAL | 0 refills | Status: DC | PRN

## 2021-06-23 NOTE — Telephone Encounter (Signed)
Pt. Called stating she needed a refill on her Hydrocodone next apt. 03/25/22 last apt was 04/14/21.

## 2021-06-24 ENCOUNTER — Telehealth: Payer: Self-pay | Admitting: Family Medicine

## 2021-06-24 NOTE — Telephone Encounter (Signed)
Pt returned Adam's call to let us know that the pain went away on its on Saturday night. She will call back if she has any more issues

## 2021-08-20 DIAGNOSIS — H35371 Puckering of macula, right eye: Secondary | ICD-10-CM | POA: Diagnosis not present

## 2021-08-20 DIAGNOSIS — Z961 Presence of intraocular lens: Secondary | ICD-10-CM | POA: Diagnosis not present

## 2021-08-20 DIAGNOSIS — H43813 Vitreous degeneration, bilateral: Secondary | ICD-10-CM | POA: Diagnosis not present

## 2021-08-20 DIAGNOSIS — H26493 Other secondary cataract, bilateral: Secondary | ICD-10-CM | POA: Diagnosis not present

## 2021-08-20 DIAGNOSIS — H04123 Dry eye syndrome of bilateral lacrimal glands: Secondary | ICD-10-CM | POA: Diagnosis not present

## 2021-09-23 ENCOUNTER — Telehealth: Payer: Self-pay

## 2021-09-23 ENCOUNTER — Other Ambulatory Visit: Payer: Self-pay | Admitting: Medical

## 2021-09-23 DIAGNOSIS — F909 Attention-deficit hyperactivity disorder, unspecified type: Secondary | ICD-10-CM

## 2021-09-23 MED ORDER — AMPHETAMINE-DEXTROAMPHETAMINE 20 MG PO TABS: 20.0000 mg | ORAL_TABLET | Freq: Two times a day (BID) | ORAL | 0 refills | Status: DC

## 2021-09-23 NOTE — Telephone Encounter (Signed)
Pt states would like refill on her Adderall, said hadn't been taking them but said she keeps forgetting things and feels like she really needs them now and would like a refill.

## 2021-09-26 NOTE — Telephone Encounter (Signed)
done

## 2021-09-29 ENCOUNTER — Telehealth: Payer: Self-pay

## 2021-09-29 NOTE — Telephone Encounter (Signed)
Pt called and [facility] out of Adderall, advised to call around and see if she can find it and call us back and let us know where

## 2021-10-08 ENCOUNTER — Other Ambulatory Visit: Payer: Self-pay | Admitting: Nurse Practitioner

## 2021-10-08 DIAGNOSIS — K7469 Other cirrhosis of liver: Secondary | ICD-10-CM | POA: Diagnosis not present

## 2021-10-08 DIAGNOSIS — I10 Essential (primary) hypertension: Secondary | ICD-10-CM | POA: Diagnosis not present

## 2021-10-14 ENCOUNTER — Ambulatory Visit (INDEPENDENT_AMBULATORY_CARE_PROVIDER_SITE_OTHER): Payer: Medicare Other | Admitting: Family Medicine

## 2021-10-14 ENCOUNTER — Encounter: Payer: Self-pay | Admitting: Family Medicine

## 2021-10-14 ENCOUNTER — Other Ambulatory Visit: Payer: Self-pay

## 2021-10-14 VITALS — BP 136/74 | HR 77 | Temp 97.7°F | Wt 251.0 lb

## 2021-10-14 DIAGNOSIS — N200 Calculus of kidney: Secondary | ICD-10-CM | POA: Diagnosis not present

## 2021-10-14 DIAGNOSIS — M545 Low back pain, unspecified: Secondary | ICD-10-CM

## 2021-10-14 LAB — POCT URINALYSIS DIP (PROADVANTAGE DEVICE)
Bilirubin, UA: NEGATIVE
Blood, UA: NEGATIVE
Glucose, UA: NEGATIVE mg/dL
Ketones, POC UA: NEGATIVE mg/dL
Leukocytes, UA: NEGATIVE
Nitrite, UA: NEGATIVE
Protein Ur, POC: NEGATIVE mg/dL
Specific Gravity, Urine: 1.01
Urobilinogen, Ur: 0.2
pH, UA: 6 (ref 5.0–8.0)

## 2021-10-14 MED ORDER — HYDROCODONE-ACETAMINOPHEN 5-325 MG PO TABS: 1.0000 | ORAL_TABLET | Freq: Four times a day (QID) | ORAL | 0 refills | Status: DC | PRN

## 2021-10-14 NOTE — Progress Notes (Signed)
? ?  Subjective:  ? ? Patient ID: Barbara Cunningham, female    DOB: Aug 31, 1950, 71 y.o.   MRN: [id number] ? ?HPI ?She is here again for having difficulty with left flank pain.  She has had this problem in the past with an extensive work-up on it all of which has been negative.  No frequency, urgency, dysuria.  She has also had help with chiropractic manipulation which apparently has been somewhat beneficial.  She also has concerns over the fact that she has gained weight and thinks it is related to her blood pressure medication.  She does complain of dizziness when she lies down since she has been on this medication and when she sits up the dizziness will go away. ? ? ?Review of Systems ? ?   ?Objective:  ? Physical Exam ?Alert and in no distress.  Blood pressure is recorded.  No flank tenderness to palpation is noted.  Urinalysis is negative. ? ? ? ?   ?Assessment & Plan:  ?Low back pain, unspecified back pain laterality, unspecified chronicity, unspecified whether sciatica present - Plan: POCT Urinalysis DIP (Proadvantage Device) ? ?Renal stone - Plan: HYDROcodone-acetaminophen (NORCO) 5-325 MG tablet ?I will give her codeine which seems to control this fairly well.  Did recommend that she stop her blood pressure medicine for a week or so and see if the symptoms go away and then start again.  If the dizziness recurs I first thought would be to switch her to a different antihypertensive medication.  I explained that I had not ever noted losartan to cause dizziness or weight gain. ? ?

## 2021-10-20 ENCOUNTER — Other Ambulatory Visit: Payer: Self-pay

## 2021-10-20 ENCOUNTER — Ambulatory Visit
Admission: RE | Admit: 2021-10-20 | Discharge: 2021-10-20 | Disposition: A | Discharge status: Home / Self Care | Payer: Medicare Other | Source: Ambulatory Visit | Attending: Nurse Practitioner | Admitting: Nurse Practitioner

## 2021-10-20 DIAGNOSIS — K7469 Other cirrhosis of liver: Secondary | ICD-10-CM

## 2021-10-20 DIAGNOSIS — K8020 Calculus of gallbladder without cholecystitis without obstruction: Secondary | ICD-10-CM | POA: Diagnosis not present

## 2021-10-25 NOTE — Telephone Encounter (Signed)
done

## 2021-12-12 ENCOUNTER — Telehealth: Payer: Self-pay

## 2021-12-12 DIAGNOSIS — F909 Attention-deficit hyperactivity disorder, unspecified type: Secondary | ICD-10-CM

## 2021-12-12 MED ORDER — AMPHETAMINE-DEXTROAMPHETAMINE 20 MG PO TABS: 20.0000 mg | ORAL_TABLET | Freq: Two times a day (BID) | ORAL | 0 refills | Status: DC

## 2021-12-12 NOTE — Telephone Encounter (Signed)
[facility] is requesting to fill pt adderall 20 mg. Please advise [facility]

## 2021-12-12 NOTE — Addendum Note (Signed)
Addended by: Denita Lung on: 12/12/2021 04:53 PM   Modules accepted: Orders

## 2022-01-16 ENCOUNTER — Other Ambulatory Visit: Payer: Self-pay

## 2022-01-16 ENCOUNTER — Telehealth: Payer: Self-pay

## 2022-01-16 DIAGNOSIS — N200 Calculus of kidney: Secondary | ICD-10-CM

## 2022-01-16 DIAGNOSIS — L309 Dermatitis, unspecified: Secondary | ICD-10-CM

## 2022-01-16 MED ORDER — HYDROCODONE-ACETAMINOPHEN 5-325 MG PO TABS: 1.0000 | ORAL_TABLET | Freq: Four times a day (QID) | ORAL | 0 refills | Status: DC | PRN

## 2022-01-16 NOTE — Telephone Encounter (Signed)
Pt. Called stating she needed a refill on her Hydrocodone to [facility] on [address]. She also wanted to know if she could get a refill put in to Dermatology to a different doctor because hers has moved to a different location that is further away from her. She would like a referral to a Dr. Jobie Quaker.

## 2022-02-26 DIAGNOSIS — L304 Erythema intertrigo: Secondary | ICD-10-CM | POA: Diagnosis not present

## 2022-02-26 DIAGNOSIS — L820 Inflamed seborrheic keratosis: Secondary | ICD-10-CM | POA: Diagnosis not present

## 2022-03-20 ENCOUNTER — Ambulatory Visit (INDEPENDENT_AMBULATORY_CARE_PROVIDER_SITE_OTHER): Payer: Medicare Other

## 2022-03-20 VITALS — BP 162/90 | HR 62 | Temp 97.6°F | Ht 64.5 in | Wt 245.0 lb

## 2022-03-20 DIAGNOSIS — Z0000 Encounter for general adult medical examination without abnormal findings: Secondary | ICD-10-CM | POA: Diagnosis not present

## 2022-03-20 NOTE — Patient Instructions (Signed)
Barbara Cunningham , Thank you for taking time to come for your Medicare Wellness Visit. I appreciate your ongoing commitment to your health goals. Please review the following plan we discussed and let me know if I can assist you in the future.   Screening recommendations/referrals: Colonoscopy: completed 05/12/2021, due 05/12/2026 Mammogram: completed 05/09/2021, due 05/10/2022 Bone Density: completed 07/08/2020 Recommended yearly ophthalmology/optometry visit for glaucoma screening and checkup Recommended yearly dental visit for hygiene and checkup  Vaccinations: Influenza vaccine: due Pneumococcal vaccine: completed 04/14/2019 Tdap vaccine: completed 03/27/2020, due 03/27/2030 Shingles vaccine: completed   Covid-19: 04/08/2021, 12/20/2020, 10/03/2019, 09/08/2019  Advanced directives: Please bring a copy of your POA (Power of Attorney) and/or Living Will to your next appointment.   Conditions/risks identified: none  Next appointment: Follow up in one year for your annual wellness visit    Preventive Care 65 Years and Older, Female Preventive care refers to lifestyle choices and visits with your health care provider that can promote health and wellness. What does preventive care include? A yearly physical exam. This is also called an annual well check. Dental exams once or twice a year. Routine eye exams. Ask your health care provider how often you should have your eyes checked. Personal lifestyle choices, including: Daily care of your teeth and gums. Regular physical activity. Eating a healthy diet. Avoiding tobacco and drug use. Limiting alcohol use. Practicing safe sex. Taking low-dose aspirin every day. Taking vitamin and mineral supplements as recommended by your health care provider. What happens during an annual well check? The services and screenings done by your health care provider during your annual well check will depend on your age, overall health, lifestyle risk factors, and  family history of disease. Counseling  Your health care provider may ask you questions about your: Alcohol use. Tobacco use. Drug use. Emotional well-being. Home and relationship well-being. Sexual activity. Eating habits. History of falls. Memory and ability to understand (cognition). Work and work Statistician. Reproductive health. Screening  You may have the following tests or measurements: Height, weight, and BMI. Blood pressure. Lipid and cholesterol levels. These may be checked every 5 years, or more frequently if you are over 62 years old. Skin check. Lung cancer screening. You may have this screening every year starting at age 52 if you have a 30-pack-year history of smoking and currently smoke or have quit within the past 15 years. Fecal occult blood test (FOBT) of the stool. You may have this test every year starting at age 28. Flexible sigmoidoscopy or colonoscopy. You may have a sigmoidoscopy every 5 years or a colonoscopy every 10 years starting at age 80. Hepatitis C blood test. Hepatitis B blood test. Sexually transmitted disease (STD) testing. Diabetes screening. This is done by checking your blood sugar (glucose) after you have not eaten for a while (fasting). You may have this done every 1-3 years. Bone density scan. This is done to screen for osteoporosis. You may have this done starting at age 56. Mammogram. This may be done every 1-2 years. Talk to your health care provider about how often you should have regular mammograms. Talk with your health care provider about your test results, treatment options, and if necessary, the need for more tests. Vaccines  Your health care provider may recommend certain vaccines, such as: Influenza vaccine. This is recommended every year. Tetanus, diphtheria, and acellular pertussis (Tdap, Td) vaccine. You may need a Td booster every 10 years. Zoster vaccine. You may need this after age 1. Pneumococcal 13-valent conjugate  (  PCV13) vaccine. One dose is recommended after age 63. Pneumococcal polysaccharide (PPSV23) vaccine. One dose is recommended after age 97. Talk to your health care provider about which screenings and vaccines you need and how often you need them. This information is not intended to replace advice given to you by your health care provider. Make sure you discuss any questions you have with your health care provider. Document Released: 08/09/2015 Document Revised: 04/01/2016 Document Reviewed: 05/14/2015 Elsevier Interactive Patient Education  2017 [place] Prevention in the Home Falls can cause injuries. They can happen to people of all ages. There are many things you can do to make your home safe and to help prevent falls. What can I do on the outside of my home? Regularly fix the edges of walkways and driveways and fix any cracks. Remove anything that might make you trip as you walk through a door, such as a raised step or threshold. Trim any bushes or trees on the path to your home. Use bright outdoor lighting. Clear any walking paths of anything that might make someone trip, such as rocks or tools. Regularly check to see if handrails are loose or broken. Make sure that both sides of any steps have handrails. Any raised decks and porches should have guardrails on the edges. Have any leaves, snow, or ice cleared regularly. Use sand or salt on walking paths during winter. Clean up any spills in your garage right away. This includes oil or grease spills. What can I do in the bathroom? Use night lights. Install grab bars by the toilet and in the tub and shower. Do not use towel bars as grab bars. Use non-skid mats or decals in the tub or shower. If you need to sit down in the shower, use a plastic, non-slip stool. Keep the floor dry. Clean up any water that spills on the floor as soon as it happens. Remove soap buildup in the tub or shower regularly. Attach bath mats securely with  double-sided non-slip rug tape. Do not have throw rugs and other things on the floor that can make you trip. What can I do in the bedroom? Use night lights. Make sure that you have a light by your bed that is easy to reach. Do not use any sheets or blankets that are too big for your bed. They should not hang down onto the floor. Have a firm chair that has side arms. You can use this for support while you get dressed. Do not have throw rugs and other things on the floor that can make you trip. What can I do in the kitchen? Clean up any spills right away. Avoid walking on wet floors. Keep items that you use a lot in easy-to-reach places. If you need to reach something above you, use a strong step stool that has a grab bar. Keep electrical cords out of the way. Do not use floor polish or wax that makes floors slippery. If you must use wax, use non-skid floor wax. Do not have throw rugs and other things on the floor that can make you trip. What can I do with my stairs? Do not leave any items on the stairs. Make sure that there are handrails on both sides of the stairs and use them. Fix handrails that are broken or loose. Make sure that handrails are as long as the stairways. Check any carpeting to make sure that it is firmly attached to the stairs. Fix any carpet that is loose  or worn. Avoid having throw rugs at the top or bottom of the stairs. If you do have throw rugs, attach them to the floor with carpet tape. Make sure that you have a light switch at the top of the stairs and the bottom of the stairs. If you do not have them, ask someone to add them for you. What else can I do to help prevent falls? Wear shoes that: Do not have high heels. Have rubber bottoms. Are comfortable and fit you well. Are closed at the toe. Do not wear sandals. If you use a stepladder: Make sure that it is fully opened. Do not climb a closed stepladder. Make sure that both sides of the stepladder are locked  into place. Ask someone to hold it for you, if possible. Clearly mark and make sure that you can see: Any grab bars or handrails. First and last steps. Where the edge of each step is. Use tools that help you move around (mobility aids) if they are needed. These include: Canes. Walkers. Scooters. Crutches. Turn on the lights when you go into a dark area. Replace any light bulbs as soon as they burn out. Set up your furniture so you have a clear path. Avoid moving your furniture around. If any of your floors are uneven, fix them. If there are any pets around you, be aware of where they are. Review your medicines with your doctor. Some medicines can make you feel dizzy. This can increase your chance of falling. Ask your doctor what other things that you can do to help prevent falls. This information is not intended to replace advice given to you by your health care provider. Make sure you discuss any questions you have with your health care provider. Document Released: 05/09/2009 Document Revised: 12/19/2015 Document Reviewed: 08/17/2014 Elsevier Interactive Patient Education  2017 [employer].

## 2022-03-20 NOTE — Progress Notes (Signed)
Subjective:   Barbara Cunningham is a 71 y.o. female who presents for Medicare Annual (Subsequent) preventive examination.  Review of Systems     Cardiac Risk Factors include: advanced age (>15mn, >>30women);obesity (BMI >30kg/m2)     Objective:    Today's Vitals   03/20/22 0951  BP: (!) 162/90  Pulse: 62  Temp: 97.6 F (36.4 C)  TempSrc: Oral  Weight: 245 lb (111.1 kg)  Height: 5' 4.5" (1.638 m)   Body mass index is 41.4 kg/m.     03/20/2022   10:00 AM 03/18/2021    8:40 AM 03/14/2020    1:53 PM 05/05/2016    7:00 PM  Advanced Directives  Does Patient Have a Medical Advance Directive? Yes No Unable to assess, patient is non-responsive or altered mental status;No No  Type of [place]     [place] of [place]? No - copy requested     Would patient like information on creating a medical advance directive?  Yes (ED - Information included in AVS) No - Patient declined No - patient declined information    Current Medications (verified) Outpatient Encounter Medications as of 03/20/2022  Medication Sig   amphetamine-dextroamphetamine (ADDERALL) 20 MG tablet Take 1 tablet (20 mg total) by mouth 2 (two) times daily.   HYDROcodone-acetaminophen (NORCO) 5-325 MG tablet Take 1 tablet by mouth every 6 (six) hours as needed for moderate pain.   losartan (COZAAR) 50 MG tablet Take 1 tablet (50 mg total) by mouth daily. (Patient not taking: Reported on 03/20/2022)   predniSONE (STERAPRED UNI-PAK 21 TAB) 10 MG (21) TBPK tablet Take by mouth daily. Tapering dose (Patient not taking: Reported on 03/04/2021)   triamcinolone cream (KENALOG) 0.1 % Apply 1 application topically 2 (two) times daily. (Patient not taking: Reported on 03/20/2022)   No facility-administered encounter medications on file as of 03/20/2022.    Allergies (verified) Patient has no known allergies.   History: Past Medical History:  Diagnosis  Date   ADD (attention deficit disorder)    Blood transfusion    Hepatitis C, chronic (HCC)    Obesity    Renal stone    Chronic, 15+ prior   Past Surgical History:  Procedure Laterality Date   BREAST BIOPSY Left 05/13/2011   core biospy   [place]  renal stone surgery  07/28/1979   RHINOPLASTY     Family History  Problem Relation Age of Onset   Vision loss Father    Breast cancer Neg Hx    Social History   Socioeconomic History   Marital status: Widowed    Spouse name: Not on file   Number of children: Not on file   Years of education: Not on file   Highest education level: Not on file  Occupational History   Not on file  Tobacco Use   Smoking status: Former   Smokeless tobacco: Never   Tobacco comments:    quit 2008  Vaping Use   Vaping Use: Never used  Substance and Sexual Activity   Alcohol use: No   Drug use: No   Sexual activity: Not on file  Other Topics Concern   Not on file  Social History Narrative   Not on file   Social Determinants of Health   Financial Resource Strain: Low Risk  (03/20/2022)   Overall Financial Resource Strain (CARDIA)    Difficulty of Paying Living Expenses: Not hard  at all  Food Insecurity: No Food Insecurity (03/20/2022)   Hunger Vital Sign    Worried About Running Out of Food in the Last Year: Never true    Ran Out of Food in the Last Year: Never true  Transportation Needs: No Transportation Needs (03/20/2022)   PRAPARE - Hydrologist (Medical): No    Lack of Transportation (Non-Medical): No  Physical Activity: Insufficiently Active (03/20/2022)   Exercise Vital Sign    Days of Exercise per Week: 7 days    Minutes of Exercise per Session: 20 min  Stress: Stress Concern Present (03/20/2022)   [place]    Feeling of Stress : To some extent  Social Connections: Not on file    Tobacco Counseling Counseling  given: Not Answered Tobacco comments: quit 2008   Clinical Intake:  Pre-visit preparation completed: Yes  Pain : No/denies pain     Nutritional Status: BMI > 30  Obese Nutritional Risks: None Diabetes: No  How often do you need to have someone help you when you read instructions, pamphlets, or other written materials from your doctor or pharmacy?: 1 - Never What is the last grade level you completed in school?: some college  Diabetic? no  Interpreter Needed?: No  Information entered by :: NAllen LPN   Activities of Daily Living    03/20/2022   10:02 AM  In your present state of health, do you have any difficulty performing the following activities:  Hearing? 0  Vision? 0  Difficulty concentrating or making decisions? 1  Comment some trouble with memory  Walking or climbing stairs? 0  Dressing or bathing? 0  Doing errands, shopping? 0  Preparing Food and eating ? N  Using the Toilet? N  In the past six months, have you accidently leaked urine? N  Do you have problems with loss of bowel control? N  Managing your Medications? N  Managing your Finances? N  Housekeeping or managing your Housekeeping? N    Patient Care Team: Denita Lung, MD as PCP - General (Family Medicine)  Indicate any recent Medical Services you may have received from other than [facility] providers in the past year (date may be approximate).     Assessment:   This is a routine wellness examination for Barbara Cunningham.  Hearing/Vision screen Vision Screening - Comments:: Regular eye exams, [facility]  Dietary issues and exercise activities discussed: Current Exercise Habits: Home exercise routine, Type of exercise: walking, Time (Minutes): 20, Frequency (Times/Week): 7, Weekly Exercise (Minutes/Week): 140   Goals Addressed             This Visit's Progress    Patient Stated       03/20/2022, wants to lose weight       Depression Screen    03/20/2022   10:01 AM 03/18/2021    8:41 AM  03/14/2020    1:55 PM 02/07/2018   11:34 AM 07/13/2016    2:14 PM  PHQ 2/9 Scores  PHQ - 2 Score 0 0 0 0 0  PHQ- 9 Score 3        Fall Risk    03/20/2022   10:00 AM 03/18/2021    8:40 AM 03/14/2020    1:54 PM 06/19/2019    5:53 PM 02/24/2018    2:00 PM  [place] in the past year? 1 0 0 0 No  Comment tripped in yard  Emmi Telephone Survey: data to providers prior to load Emmi Telephone Survey: data to providers prior to load  Number falls in past yr: 0 0     Injury with Fall? 0 0     Risk for fall due to : Medication side effect No Fall Risks No Fall Risks    Follow up Falls evaluation completed;Education provided;Falls prevention discussed Falls evaluation completed       FALL RISK PREVENTION PERTAINING TO THE HOME:  Any stairs in or around the home? Yes  If so, are there any without handrails? No  Home free of loose throw rugs in walkways, pet beds, electrical cords, etc? Yes  Adequate lighting in your home to reduce risk of falls? Yes   ASSISTIVE DEVICES UTILIZED TO PREVENT FALLS:  Life alert? No  Use of a cane, walker or w/c? No  Grab bars in the bathroom? Yes  Shower chair or bench in shower? Yes  Elevated toilet seat or a handicapped toilet? Yes   TIMED UP AND GO:  Was the test performed? No .    Gait steady and fast without use of assistive device  Cognitive Function:        03/20/2022   10:03 AM  6CIT Screen  What Year? 0 points  What month? 0 points  What time? 0 points  Count back from 20 0 points  Months in reverse 0 points  Repeat phrase 0 points  Total Score 0 points    Immunizations Immunization History  Administered Date(s) Administered   Fluad Quad(high Dose 65+) 04/14/2019, 06/03/2020   Influenza-Unspecified 06/06/2021   PFIZER(Purple Top)SARS-COV-2 Vaccination 09/08/2019, 10/03/2019, 12/20/2020, 04/08/2021   Pneumococcal Conjugate-13 04/14/2019   Pneumococcal Polysaccharide-23 10/24/2010   Tdap 10/24/2010, 03/27/2020   Zoster  Recombinat (Shingrix) 03/27/2020, 05/22/2020, 08/22/2020   Zoster, Live 10/24/2010    TDAP status: Up to date  Flu Vaccine status: Due, Education has been provided regarding the importance of this vaccine. Advised may receive this vaccine at local pharmacy or Health Dept. Aware to provide a copy of the vaccination record if obtained from local pharmacy or Health Dept. Verbalized acceptance and understanding.  Pneumococcal vaccine status: Up to date  Covid-19 vaccine status: Completed vaccines  Qualifies for Shingles Vaccine? Yes   Zostavax completed Yes   Shingrix Completed?: Yes  Screening Tests Health Maintenance  Topic Date Due   COVID-19 Vaccine (5 - Pfizer risk series) 06/03/2021   INFLUENZA VACCINE  02/24/2022   MAMMOGRAM  05/10/2023   COLONOSCOPY (Pts 45-81yr Insurance coverage will need to be confirmed)  05/12/2026   TETANUS/TDAP  03/27/2030   DEXA SCAN  Completed   Hepatitis C Screening  Completed   Zoster Vaccines- Shingrix  Completed   HPV VACCINES  Aged Out   Pneumonia Vaccine 71 Years old  Discontinued   Fecal DNA (Cologuard)  Discontinued    Health Maintenance  Health Maintenance Due  Topic Date Due   COVID-19 Vaccine (5 - Pfizer risk series) 06/03/2021   INFLUENZA VACCINE  02/24/2022    Colorectal cancer screening: Type of screening: Colonoscopy. Completed 05/12/2021. Repeat every 5 years  Mammogram status: Completed 05/09/2021. Repeat every year  Bone Density status: Completed 07/08/2020.   Lung Cancer Screening: (Low Dose CT Chest recommended if Age 73-80years, 30 pack-year currently smoking OR have quit w/in 15years.) does not qualify.   Lung Cancer Screening Referral: no  Additional Screening:  Hepatitis C Screening: does qualify; Completed 03/18/2021  Vision Screening: Recommended annual ophthalmology exams for early  detection of glaucoma and other disorders of the eye. Is the patient up to date with their annual eye exam?  Yes  Who is the  provider or what is the name of the office in which the patient attends annual eye exams? [facility] If pt is not established with a provider, would they like to be referred to a provider to establish care? No .   Dental Screening: Recommended annual dental exams for proper oral hygiene  Community Resource Referral / Chronic Care Management: CRR required this visit?  No   CCM required this visit?  No      Plan:     I have personally reviewed and noted the following in the patient's chart:   Medical and social history Use of alcohol, tobacco or illicit drugs  Current medications and supplements including opioid prescriptions. Patient is currently taking opioid prescriptions. Information provided to patient regarding non-opioid alternatives. Patient advised to discuss non-opioid treatment plan with their provider. Functional ability and status Nutritional status Physical activity Advanced directives List of other physicians Hospitalizations, surgeries, and ER visits in previous 12 months Vitals Screenings to include cognitive, depression, and falls Referrals and appointments  In addition, I have reviewed and discussed with patient certain preventive protocols, quality metrics, and best practice recommendations. A written personalized care plan for preventive services as well as general preventive health recommendations were provided to patient.     Kellie Simmering, LPN   1/61/0960   Nurse Notes: none

## 2022-03-25 ENCOUNTER — Encounter: Payer: Self-pay | Admitting: Family Medicine

## 2022-03-25 ENCOUNTER — Ambulatory Visit (INDEPENDENT_AMBULATORY_CARE_PROVIDER_SITE_OTHER): Payer: Medicare Other | Admitting: Family Medicine

## 2022-03-25 VITALS — BP 134/88 | HR 63 | Temp 96.8°F | Ht 64.5 in | Wt 244.6 lb

## 2022-03-25 DIAGNOSIS — Z87442 Personal history of urinary calculi: Secondary | ICD-10-CM | POA: Diagnosis not present

## 2022-03-25 DIAGNOSIS — Z7189 Other specified counseling: Secondary | ICD-10-CM

## 2022-03-25 DIAGNOSIS — F988 Other specified behavioral and emotional disorders with onset usually occurring in childhood and adolescence: Secondary | ICD-10-CM

## 2022-03-25 DIAGNOSIS — Z13220 Encounter for screening for lipoid disorders: Secondary | ICD-10-CM | POA: Diagnosis not present

## 2022-03-25 DIAGNOSIS — Z136 Encounter for screening for cardiovascular disorders: Secondary | ICD-10-CM | POA: Diagnosis not present

## 2022-03-25 DIAGNOSIS — I10 Essential (primary) hypertension: Secondary | ICD-10-CM

## 2022-03-25 DIAGNOSIS — Z6379 Other stressful life events affecting family and household: Secondary | ICD-10-CM

## 2022-03-25 DIAGNOSIS — Z9189 Other specified personal risk factors, not elsewhere classified: Secondary | ICD-10-CM | POA: Insufficient documentation

## 2022-03-25 DIAGNOSIS — Z8619 Personal history of other infectious and parasitic diseases: Secondary | ICD-10-CM | POA: Diagnosis not present

## 2022-03-25 DIAGNOSIS — D126 Benign neoplasm of colon, unspecified: Secondary | ICD-10-CM | POA: Diagnosis not present

## 2022-03-25 DIAGNOSIS — F909 Attention-deficit hyperactivity disorder, unspecified type: Secondary | ICD-10-CM

## 2022-03-25 MED ORDER — AMPHETAMINE-DEXTROAMPHETAMINE 20 MG PO TABS: 20.0000 mg | ORAL_TABLET | Freq: Two times a day (BID) | ORAL | 0 refills | Status: DC

## 2022-03-25 MED ORDER — LISINOPRIL 10 MG PO TABS: 10.0000 mg | ORAL_TABLET | Freq: Every day | ORAL | 3 refills | Status: DC

## 2022-03-25 NOTE — Patient Instructions (Signed)

## 2022-03-25 NOTE — Progress Notes (Signed)
Complete Med check exam  Patient: Barbara Cunningham   DOB: 15-Sep-1950   71 y.o. Female  MRN: [id number]  Subjective:    Chief Complaint  Patient presents with   Annual Exam    Fasting     Barbara Cunningham is a 71 y.o. female who presents today for a med check exam. She reports consuming a general and low sodium diet.  Working at [employer] lots of stairs  She generally feels well. She reports sleeping well. She stopped taking the losartan as because a great deal of difficulty with dizziness.  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  She does have a history of colonic polyp in the colonoscopy did show evidence of ileitis however at this point she is having no abdominal distress.  She has a previous history of hepatitis C with treatment but did cause some cirrhotic changes of her liver.  She continues to do nicely on her Adderall and does use it on an as-needed basis.   Most recent fall risk assessment:    03/20/2022   10:00 AM  [place] in the past year? 1  Comment tripped in yard  Number falls in past yr: 0  Injury with Fall? 0  Risk for fall due to : Medication side effect  Follow up Falls evaluation completed;Education provided;Falls prevention discussed     Most recent depression screenings:    03/20/2022   10:01 AM 03/18/2021    8:41 AM  PHQ 2/9 Scores  PHQ - 2 Score 0 0  PHQ- 9 Score 3       Patient Active Problem List   Diagnosis Date Noted   At high risk for cardiovascular disease 03/25/2022   Tubular adenoma of colon 05/21/2021   Ileitis, terminal, other complication ([place]) 24/23/5361   Calculus of gallbladder without cholecystitis without obstruction 10/31/2020   Cirrhosis of liver without ascites ([place]) 10/31/2020   History of renal stone 03/14/2020   Morbid obesity ([place]) 03/14/2020   ADD (attention deficit disorder) 08/24/2011   History of hepatitis C 08/24/2011   Abnormal breast  finding 08/24/2011   Counseling on health promotion and disease prevention 08/24/2011   Past Medical History:  Diagnosis Date   ADD (attention deficit disorder)    Blood transfusion    Hepatitis C, chronic ([place])    Obesity    Renal stone    Chronic, 15+ prior   Past Surgical History:  Procedure Laterality Date   BREAST BIOPSY Left 05/13/2011   core biospy   CESAREAN SECTION  1979, 1981   renal stone surgery  07/28/1979   RHINOPLASTY     Social History   Tobacco Use   Smoking status: Former   Smokeless tobacco: Never   Tobacco comments:    quit 2008  Vaping Use   Vaping Use: Never used  Substance Use Topics   Alcohol use: No   Drug use: No   Family History  Problem Relation Age of Onset   Vision loss Father    Breast cancer Neg Hx    No Known Allergies    Patient Care Team: Denita Lung, MD as PCP - General (Family Medicine)   Outpatient Medications Prior to Visit  Medication Sig Note   HYDROcodone-acetaminophen (NORCO) 5-325 MG tablet Take 1 tablet by mouth every 6 (six) hours as needed for moderate pain. 03/25/2022: Prn last dose eight weeks ago   [  DISCONTINUED] amphetamine-dextroamphetamine (ADDERALL) 20 MG tablet Take 1 tablet (20 mg total) by mouth 2 (two) times daily.    predniSONE (STERAPRED UNI-PAK 21 TAB) 10 MG (21) TBPK tablet Take by mouth daily. Tapering dose (Patient not taking: Reported on 03/04/2021)    triamcinolone cream (KENALOG) 0.1 % Apply 1 application topically 2 (two) times daily. (Patient not taking: Reported on 03/20/2022)    [DISCONTINUED] losartan (COZAAR) 50 MG tablet Take 1 tablet (50 mg total) by mouth daily. (Patient not taking: Reported on 03/20/2022) 03/25/2022: Last dose two months after start date due to dizzy   No facility-administered medications prior to visit.    Review of Systems  All other systems reviewed and are negative.         Objective:     BP 134/88   Pulse 63   Temp (!) 96.8 F (36 C)   Ht 5' 4.5"  (1.638 m)   Wt 244 lb 9.6 oz (110.9 kg)   SpO2 97%   BMI 41.34 kg/m  BP Readings from Last 3 Encounters:  03/25/22 134/88  03/20/22 (!) 162/90  10/14/21 136/74   Wt Readings from Last 3 Encounters:  03/25/22 244 lb 9.6 oz (110.9 kg)  03/20/22 245 lb (111.1 kg)  10/14/21 251 lb (113.9 kg)    The 10-year ASCVD risk score (Arnett DK, et al., 2019) is: 24.6%   Values used to calculate the score:     Age: 71 years     Sex: Female     Is Non-Hispanic African American: No     Diabetic: No     Tobacco smoker: Yes     Systolic Blood Pressure: 443 mmHg     Is BP treated: Yes     HDL Cholesterol: 51 mg/dL     Total Cholesterol: 168 mg/dL   Physical Exam  Alert and in no distress. Tympanic membranes and canals are normal. Pharyngeal area is normal. Neck is supple without adenopathy or thyromegaly. Cardiac exam shows a regular sinus rhythm without murmurs or gallops. Lungs are clear to auscultation.  Last CBC Lab Results  Component Value Date   WBC 6.2 03/18/2021   HGB 14.1 03/18/2021   HCT 42.0 03/18/2021   MCV 89 03/18/2021   MCH 30.0 03/18/2021   RDW 12.8 03/18/2021   PLT 225 15/40/0867   Last metabolic panel Lab Results  Component Value Date   GLUCOSE 85 03/18/2021   NA 139 03/18/2021   K 4.4 03/18/2021   CL 107 (H) 03/18/2021   CO2 18 (L) 03/18/2021   BUN 21 03/18/2021   CREATININE 0.84 03/18/2021   EGFR 74 03/18/2021   CALCIUM 9.0 03/18/2021   PROT 6.5 03/18/2021   ALBUMIN 3.9 03/18/2021   LABGLOB 2.6 03/18/2021   AGRATIO 1.5 03/18/2021   BILITOT 0.3 03/18/2021   ALKPHOS 104 03/18/2021   AST 21 03/18/2021   ALT 17 03/18/2021   Last lipids Lab Results  Component Value Date   CHOL 168 03/18/2021   HDL 51 03/18/2021   LDLCALC 103 (H) 03/18/2021   TRIG 76 03/18/2021   CHOLHDL 3.3 03/18/2021        Assessment & Plan:   Attention deficit disorder, unspecified hyperactivity presence  History of hepatitis C  Morbid obesity ([place]) - Plan: CBC with  Differential/Platelet, Comprehensive metabolic panel  History of renal stone  Counseling on health promotion and disease prevention  Tubular adenoma of colon  Primary hypertension - Plan: lisinopril (ZESTRIL) 10 MG tablet  Attention deficit hyperactivity  disorder (ADHD), unspecified ADHD type - Plan: amphetamine-dextroamphetamine (ADDERALL) 20 MG tablet  Stress due to illness of family member  Screening for lipid disorders - Plan: Lipid panel  At high risk for cardiovascular disease    Immunization History  Administered Date(s) Administered   Fluad Quad(high Dose 65+) 04/14/2019, 06/03/2020   Influenza-Unspecified 06/06/2021   PFIZER(Purple Top)SARS-COV-2 Vaccination 09/08/2019, 10/03/2019, 12/20/2020, 04/08/2021   Pneumococcal Conjugate-13 04/14/2019   Pneumococcal Polysaccharide-23 10/24/2010   Tdap 10/24/2010, 03/27/2020   Zoster Recombinat (Shingrix) 03/27/2020, 05/22/2020, 08/22/2020   Zoster, Live 10/24/2010    Health Maintenance  Topic Date Due   COVID-19 Vaccine (5 - Pfizer risk series) 06/03/2021   INFLUENZA VACCINE  02/24/2022   MAMMOGRAM  05/10/2023   COLONOSCOPY (Pts 45-22yr Insurance coverage will need to be confirmed)  05/12/2026   TETANUS/TDAP  03/27/2030   DEXA SCAN  Completed   Hepatitis C Screening  Completed   Zoster Vaccines- Shingrix  Completed   HPV VACCINES  Aged Out   Pneumonia Vaccine 71 Years old  Discontinued   Fecal DNA (Cologuard)  Discontinued    Discussed health benefits of physical activity, and encouraged her to engage in regular exercise appropriate for her age and condition.  Problem List Items Addressed This Visit     ADD (attention deficit disorder) - Primary   Relevant Medications   amphetamine-dextroamphetamine (ADDERALL) 20 MG tablet   At high risk for cardiovascular disease   Counseling on health promotion and disease prevention   History of hepatitis C   History of renal stone   Morbid obesity (HCC)   Relevant  Medications   amphetamine-dextroamphetamine (ADDERALL) 20 MG tablet   Other Relevant Orders   CBC with Differential/Platelet   Comprehensive metabolic panel   Tubular adenoma of colon   Other Visit Diagnoses     Primary hypertension       Relevant Medications   lisinopril (ZESTRIL) 10 MG tablet   Stress due to illness of family member       Screening for lipid disorders       Relevant Orders   Lipid panel     I will switch her to lisinopril.  She is to come back in 1 month with her blood pressure cuff so we can reevaluate that.  Also discussed taking better care of herself and putting her wants needs and desires higher up on her list.  Did not discuss cardiovascular risk but will wait for the blood result to come in and discuss it at that point.  Her ADD med was renewed.     JJill Alexanders MD

## 2022-03-26 LAB — COMPREHENSIVE METABOLIC PANEL
ALT: 13 IU/L (ref 0–32)
AST: 17 IU/L (ref 0–40)
Albumin/Globulin Ratio: 1.8 (ref 1.2–2.2)
Albumin: 4.4 g/dL (ref 3.8–4.8)
Alkaline Phosphatase: 111 IU/L (ref 44–121)
BUN/Creatinine Ratio: 17 (ref 12–28)
BUN: 14 mg/dL (ref 8–27)
Bilirubin Total: 0.3 mg/dL (ref 0.0–1.2)
CO2: 21 mmol/L (ref 20–29)
Calcium: 9.4 mg/dL (ref 8.7–10.3)
Chloride: 108 mmol/L — ABNORMAL HIGH (ref 96–106)
Creatinine, Ser: 0.83 mg/dL (ref 0.57–1.00)
Globulin, Total: 2.4 g/dL (ref 1.5–4.5)
Glucose: 99 mg/dL (ref 70–99)
Potassium: 4.6 mmol/L (ref 3.5–5.2)
Sodium: 142 mmol/L (ref 134–144)
Total Protein: 6.8 g/dL (ref 6.0–8.5)
eGFR: 75 mL/min/{1.73_m2} (ref >59)

## 2022-03-26 LAB — CBC WITH DIFFERENTIAL/PLATELET
Basophils Absolute: 0.1 10*3/uL (ref 0.0–0.2)
Basos: 2 %
EOS (ABSOLUTE): 0.2 10*3/uL (ref 0.0–0.4)
Eos: 4 %
Hematocrit: 43.1 % (ref 34.0–46.6)
Hemoglobin: 14.6 g/dL (ref 11.1–15.9)
Immature Grans (Abs): 0 10*3/uL (ref 0.0–0.1)
Immature Granulocytes: 0 %
Lymphocytes Absolute: 1.8 10*3/uL (ref 0.7–3.1)
Lymphs: 30 %
MCH: 30 pg (ref 26.6–33.0)
MCHC: 33.9 g/dL (ref 31.5–35.7)
MCV: 89 fL (ref 79–97)
Monocytes Absolute: 0.5 10*3/uL (ref 0.1–0.9)
Monocytes: 9 %
Neutrophils Absolute: 3.4 10*3/uL (ref 1.4–7.0)
Neutrophils: 55 %
Platelets: 211 10*3/uL (ref 150–450)
RBC: 4.86 x10E6/uL (ref 3.77–5.28)
RDW: 13 % (ref 11.7–15.4)
WBC: 6.1 10*3/uL (ref 3.4–10.8)

## 2022-03-31 LAB — LIPID PANEL
Chol/HDL Ratio: 3.8 ratio (ref 0.0–4.4)
Cholesterol, Total: 198 mg/dL (ref 100–199)
HDL: 52 mg/dL (ref >39)
LDL Chol Calc (NIH): 125 mg/dL — ABNORMAL HIGH (ref 0–99)
Triglycerides: 120 mg/dL (ref 0–149)
VLDL Cholesterol Cal: 21 mg/dL (ref 5–40)

## 2022-03-31 LAB — SPECIMEN STATUS REPORT

## 2022-04-01 ENCOUNTER — Encounter: Payer: Self-pay | Admitting: Internal Medicine

## 2022-04-01 ENCOUNTER — Other Ambulatory Visit: Payer: Self-pay | Admitting: Nurse Practitioner

## 2022-04-01 DIAGNOSIS — K7469 Other cirrhosis of liver: Secondary | ICD-10-CM | POA: Diagnosis not present

## 2022-04-01 DIAGNOSIS — K7581 Nonalcoholic steatohepatitis (NASH): Secondary | ICD-10-CM

## 2022-04-08 ENCOUNTER — Ambulatory Visit
Admission: RE | Admit: 2022-04-08 | Discharge: 2022-04-08 | Disposition: A | Discharge status: Home / Self Care | Payer: Medicare Other | Source: Ambulatory Visit | Attending: Nurse Practitioner | Admitting: Nurse Practitioner

## 2022-04-08 DIAGNOSIS — K7581 Nonalcoholic steatohepatitis (NASH): Secondary | ICD-10-CM

## 2022-04-08 DIAGNOSIS — K8020 Calculus of gallbladder without cholecystitis without obstruction: Secondary | ICD-10-CM | POA: Diagnosis not present

## 2022-04-08 DIAGNOSIS — K7469 Other cirrhosis of liver: Secondary | ICD-10-CM

## 2022-04-21 ENCOUNTER — Encounter: Payer: Self-pay | Admitting: Family Medicine

## 2022-04-21 ENCOUNTER — Ambulatory Visit (INDEPENDENT_AMBULATORY_CARE_PROVIDER_SITE_OTHER): Payer: Medicare Other | Admitting: Family Medicine

## 2022-04-21 VITALS — BP 140/82 | HR 57 | Temp 97.0°F | Wt 247.6 lb

## 2022-04-21 DIAGNOSIS — N200 Calculus of kidney: Secondary | ICD-10-CM | POA: Diagnosis not present

## 2022-04-21 DIAGNOSIS — I10 Essential (primary) hypertension: Secondary | ICD-10-CM

## 2022-04-21 MED ORDER — HYDROCODONE-ACETAMINOPHEN 5-325 MG PO TABS: 1.0000 | ORAL_TABLET | Freq: Four times a day (QID) | ORAL | 0 refills | Status: DC | PRN

## 2022-04-21 NOTE — Patient Instructions (Signed)
DASH diet

## 2022-04-21 NOTE — Progress Notes (Signed)
   Subjective:    Patient ID: Barbara Cunningham, female    DOB: Dec 23, 1950, 71 y.o.   MRN: [id number]  HPI She is here for a recheck on her blood pressure.  She did bring in her cuff and it was measured against ours.  She also needs with codeine renewed for her intermittent back pain.   Review of Systems     Objective:   Physical Exam Alert and in no distress.  Blood pressure is recorded.       Assessment & Plan:  Primary hypertension  Renal stone - Plan: HYDROcodone-acetaminophen (NORCO) 5-325 MG tablet She will continue on her present medication and return here in 2 months.  She is to buy a new cuff and bring it in to measure properly.  Norco also given.

## 2022-05-05 ENCOUNTER — Encounter: Payer: Self-pay | Admitting: Internal Medicine

## 2022-05-18 ENCOUNTER — Encounter: Payer: Self-pay | Admitting: Internal Medicine

## 2022-06-17 ENCOUNTER — Ambulatory Visit (INDEPENDENT_AMBULATORY_CARE_PROVIDER_SITE_OTHER): Payer: Medicare Other | Admitting: Family Medicine

## 2022-06-17 ENCOUNTER — Encounter: Payer: Self-pay | Admitting: Family Medicine

## 2022-06-17 DIAGNOSIS — F909 Attention-deficit hyperactivity disorder, unspecified type: Secondary | ICD-10-CM

## 2022-06-17 DIAGNOSIS — I10 Essential (primary) hypertension: Secondary | ICD-10-CM

## 2022-06-17 MED ORDER — AMPHETAMINE-DEXTROAMPHETAMINE 20 MG PO TABS: 20.0000 mg | ORAL_TABLET | Freq: Two times a day (BID) | ORAL | 0 refills | Status: DC

## 2022-06-17 MED ORDER — LISINOPRIL 10 MG PO TABS: 10.0000 mg | ORAL_TABLET | Freq: Every day | ORAL | 3 refills | Status: DC

## 2022-06-17 NOTE — Addendum Note (Signed)
Addended by: Denita Lung on: 06/17/2022 12:30 PM   Modules accepted: Orders

## 2022-06-17 NOTE — Progress Notes (Addendum)
   Subjective:    Patient ID: Barbara Cunningham, female    DOB: Jun 16, 1951, 71 y.o.   MRN: [id number]  HPI She is here for recheck on her blood pressure.  She is now taking lisinopril as her previous medication had unacceptable side effects.  This 1 has had no trouble.  She did bring her blood pressure cuff in and it is accurate. She also needs a refill on her Adderall.  Review of Systems     Objective:   Physical Exam  Alert and in no distress.  Blood pressure is recorded.      Assessment & Plan:  Primary hypertension - Plan: lisinopril (ZESTRIL) 10 MG tablet  Attention deficit hyperactivity disorder (ADHD), unspecified ADHD type - Plan: amphetamine-dextroamphetamine (ADDERALL) 20 MG tablet She will continue to monitor her blood pressure and return here on a yearly basis or sooner if needed.

## 2022-06-23 ENCOUNTER — Ambulatory Visit: Payer: Medicare Other | Admitting: Family Medicine

## 2022-07-30 ENCOUNTER — Telehealth: Payer: Self-pay | Admitting: Family Medicine

## 2022-07-30 DIAGNOSIS — N200 Calculus of kidney: Secondary | ICD-10-CM

## 2022-07-30 MED ORDER — HYDROCODONE-ACETAMINOPHEN 5-325 MG PO TABS: 1.0000 | ORAL_TABLET | Freq: Four times a day (QID) | ORAL | 0 refills | Status: DC | PRN

## 2022-07-30 NOTE — Telephone Encounter (Signed)
Countess requesting refill on hydrocodone to [facility] [phone] - [place], [place]

## 2022-08-24 DIAGNOSIS — H43813 Vitreous degeneration, bilateral: Secondary | ICD-10-CM | POA: Diagnosis not present

## 2022-08-24 DIAGNOSIS — H04123 Dry eye syndrome of bilateral lacrimal glands: Secondary | ICD-10-CM | POA: Diagnosis not present

## 2022-08-24 DIAGNOSIS — H35371 Puckering of macula, right eye: Secondary | ICD-10-CM | POA: Diagnosis not present

## 2022-08-24 DIAGNOSIS — Z961 Presence of intraocular lens: Secondary | ICD-10-CM | POA: Diagnosis not present

## 2022-09-01 DIAGNOSIS — H26492 Other secondary cataract, left eye: Secondary | ICD-10-CM | POA: Diagnosis not present

## 2022-09-02 ENCOUNTER — Ambulatory Visit (INDEPENDENT_AMBULATORY_CARE_PROVIDER_SITE_OTHER): Payer: Medicare Other | Admitting: Family Medicine

## 2022-09-02 ENCOUNTER — Encounter: Payer: Self-pay | Admitting: Family Medicine

## 2022-09-02 VITALS — BP 122/80 | HR 68 | Temp 97.4°F | Resp 16 | Wt 244.4 lb

## 2022-09-02 DIAGNOSIS — M545 Low back pain, unspecified: Secondary | ICD-10-CM | POA: Diagnosis not present

## 2022-09-02 NOTE — Patient Instructions (Signed)
You can take 2 Tylenol 4 times per day and you can add to that 2 Aleve twice per day then the codeine lets do that through the weekend

## 2022-09-02 NOTE — Progress Notes (Signed)
   Subjective:    Patient ID: Barbara Cunningham, female    DOB: 27-Mar-1951, 73 y.o.   MRN: [id number]  HPI She states that she woke up Friday with left lateral calf pain.  The pain is then migrated proximal and is now mainly in the left gluteal area.  She states she has a previous history of sciatica on that side from several years ago.  She has been using maximum dosing of aspirin which has helped.  She recently did take some codeine which is also easy.  She has had no calf swelling   Review of Systems     Objective:   Physical Exam Some tenderness to palpation in the left upper SI area.  Normal hip motion.  Negative straight leg raising.  DTRs normal.  Homans' sign negative.       Assessment & Plan:  Acute left-sided low back pain, unspecified whether sciatica present I explained that at this point there is no evidence of a pinched nerve.  Recommend conservative care with Tylenol, NSAID and then finally use of codeine if needed.  She will keep me informed as to how she is doing.

## 2022-09-04 ENCOUNTER — Telehealth: Payer: Self-pay | Admitting: Family Medicine

## 2022-09-04 DIAGNOSIS — N200 Calculus of kidney: Secondary | ICD-10-CM

## 2022-09-04 MED ORDER — HYDROCODONE-ACETAMINOPHEN 5-325 MG PO TABS: 1.0000 | ORAL_TABLET | Freq: Four times a day (QID) | ORAL | 0 refills | Status: DC | PRN

## 2022-09-04 NOTE — Telephone Encounter (Signed)
Pt called for refills of Norco 5-325 MG. Please send to [place] Pt can be reached at [phone].

## 2022-09-14 ENCOUNTER — Encounter: Payer: Self-pay | Admitting: Family Medicine

## 2022-09-14 DIAGNOSIS — N200 Calculus of kidney: Secondary | ICD-10-CM

## 2022-09-15 MED ORDER — HYDROCODONE-ACETAMINOPHEN 5-325 MG PO TABS: 1.0000 | ORAL_TABLET | Freq: Four times a day (QID) | ORAL | 0 refills | Status: DC | PRN

## 2022-09-30 ENCOUNTER — Other Ambulatory Visit: Payer: Self-pay | Admitting: Nurse Practitioner

## 2022-09-30 DIAGNOSIS — K7469 Other cirrhosis of liver: Secondary | ICD-10-CM

## 2022-09-30 DIAGNOSIS — K7581 Nonalcoholic steatohepatitis (NASH): Secondary | ICD-10-CM

## 2022-09-30 DIAGNOSIS — L821 Other seborrheic keratosis: Secondary | ICD-10-CM | POA: Diagnosis not present

## 2022-10-02 MED ORDER — HYDROCODONE-ACETAMINOPHEN 5-325 MG PO TABS: 1.0000 | ORAL_TABLET | Freq: Four times a day (QID) | ORAL | 0 refills | Status: DC | PRN

## 2022-10-02 NOTE — Addendum Note (Signed)
Addended by: Denita Lung on: 10/02/2022 04:42 PM   Modules accepted: Orders

## 2022-11-02 ENCOUNTER — Ambulatory Visit
Admission: RE | Admit: 2022-11-02 | Discharge: 2022-11-02 | Disposition: A | Discharge status: Home / Self Care | Payer: Medicare Other | Source: Ambulatory Visit | Attending: Nurse Practitioner | Admitting: Nurse Practitioner

## 2022-11-02 DIAGNOSIS — K7581 Nonalcoholic steatohepatitis (NASH): Secondary | ICD-10-CM

## 2022-11-02 DIAGNOSIS — K8020 Calculus of gallbladder without cholecystitis without obstruction: Secondary | ICD-10-CM | POA: Diagnosis not present

## 2022-11-02 DIAGNOSIS — K7460 Unspecified cirrhosis of liver: Secondary | ICD-10-CM | POA: Diagnosis not present

## 2022-11-02 DIAGNOSIS — K7469 Other cirrhosis of liver: Secondary | ICD-10-CM

## 2023-01-19 ENCOUNTER — Ambulatory Visit (INDEPENDENT_AMBULATORY_CARE_PROVIDER_SITE_OTHER): Payer: Medicare Other

## 2023-01-19 VITALS — Ht 65.0 in | Wt 242.0 lb

## 2023-01-19 DIAGNOSIS — Z0000 Encounter for general adult medical examination without abnormal findings: Secondary | ICD-10-CM

## 2023-01-19 NOTE — Progress Notes (Signed)
Subjective:   Barbara Cunningham is a 72 y.o. female who presents for Medicare Annual (Subsequent) preventive examination.  Visit Complete: Virtual  I connected with  Barbara Cunningham on 01/19/23 by a audio enabled telemedicine application and verified that I am speaking with the correct person using two identifiers.  Patient Location: Home  Provider Location: Office/Clinic  I discussed the limitations of evaluation and management by telemedicine. The patient expressed understanding and agreed to proceed.    Review of Systems     Cardiac Risk Factors include: advanced age (>27men, >25 women);obesity (BMI >30kg/m2)     Objective:    Today's Vitals   01/19/23 1201  Weight: 242 lb (109.8 kg)  Height: 5\' 5"  (1.651 m)   Body mass index is 40.27 kg/m.     01/19/2023   12:07 PM 03/20/2022   10:00 AM 03/18/2021    8:40 AM 03/14/2020    1:53 PM 05/05/2016    7:00 PM  Advanced Directives  Does Patient Have a Medical Advance Directive? Yes Yes No Unable to assess, patient is non-responsive or altered mental status;No No  Type of Estate agent of [employer] Power of [place];Living will     Copy of Healthcare Power of Attorney in Chart? No - copy requested No - copy requested     Would patient like information on creating a medical advance directive?   Yes (ED - Information included in AVS) No - Patient declined No - patient declined information    Current Medications (verified) Outpatient Encounter Medications as of 01/19/2023  Medication Sig   amphetamine-dextroamphetamine (ADDERALL) 20 MG tablet Take 1 tablet (20 mg total) by mouth 2 (two) times daily. (Patient taking differently: Take 20 mg by mouth 2 (two) times daily. As needed)   cholecalciferol (VITAMIN D3) 25 MCG (1000 UNIT) tablet Take 1,000 Units by mouth daily.   Cyanocobalamin (B-12) 1000 MCG TABS Take 1 tablet by mouth daily.   HYDROcodone-acetaminophen (NORCO) 5-325 MG tablet Take 1 tablet  by mouth every 6 (six) hours as needed for moderate pain.   lisinopril (ZESTRIL) 10 MG tablet Take 1 tablet (10 mg total) by mouth daily.   triamcinolone cream (KENALOG) 0.1 % Apply 1 application topically 2 (two) times daily.   No facility-administered encounter medications on file as of 01/19/2023.    Allergies (verified) Patient has no known allergies.   History: Past Medical History:  Diagnosis Date   ADD (attention deficit disorder)    Blood transfusion    Hepatitis C, chronic (HCC)    Obesity    Renal stone    Chronic, 15+ prior   Past Surgical History:  Procedure Laterality Date   BREAST BIOPSY Left 05/13/2011   core biospy   CESAREAN SECTION  1979, 1981   renal stone surgery  07/28/1979   RHINOPLASTY     Family History  Problem Relation Age of Onset   Vision loss Father    Breast cancer Neg Hx    Social History   Socioeconomic History   Marital status: Widowed    Spouse name: Not on file   Number of children: Not on file   Years of education: Not on file   Highest education level: Not on file  Occupational History   Not on file  Tobacco Use   Smoking status: Former   Smokeless tobacco: Never   Tobacco comments:    quit 2008  Vaping Use   Vaping Use: Never used  Substance and Sexual  Activity   Alcohol use: No   Drug use: No   Sexual activity: Not on file  Other Topics Concern   Not on file  Social History Narrative   Not on file   Social Determinants of Health   Financial Resource Strain: Low Risk  (01/19/2023)   Overall Financial Resource Strain (CARDIA)    Difficulty of Paying Living Expenses: Not hard at all  Food Insecurity: No Food Insecurity (01/19/2023)   Hunger Vital Sign    Worried About Running Out of Food in the Last Year: Never true    Ran Out of Food in the Last Year: Never true  Transportation Needs: No Transportation Needs (01/19/2023)   PRAPARE - Administrator, Civil Service (Medical): No    Lack of Transportation  (Non-Medical): No  Physical Activity: Inactive (01/19/2023)   Exercise Vital Sign    Days of Exercise per Week: 0 days    Minutes of Exercise per Session: 0 min  Stress: Stress Concern Present (01/19/2023)   [employer] of Occupational Health - Occupational Stress Questionnaire    Feeling of Stress : To some extent  Social Connections: Moderately Integrated (01/19/2023)   Social Connection and Isolation Panel [NHANES]    Frequency of Communication with Friends and Family: More than three times a week    Frequency of Social Gatherings with Friends and Family: More than three times a week    Attends Religious Services: More than 4 times per year    Active Member of [employer] or Organizations: No    Attends Engineer, structural: More than 4 times per year    Marital Status: Widowed    Tobacco Counseling Counseling given: Not Answered Tobacco comments: quit 2008   Clinical Intake:  Pre-visit preparation completed: Yes  Pain : No/denies pain     Nutritional Status: BMI > 30  Obese Nutritional Risks: None Diabetes: No  How often do you need to have someone help you when you read instructions, pamphlets, or other written materials from your doctor or pharmacy?: 1 - Never  Interpreter Needed?: No  Information entered by :: NAllen LPN   Activities of Daily Living    01/19/2023   12:02 PM 03/20/2022   10:02 AM  In your present state of health, do you have any difficulty performing the following activities:  Hearing? 0 0  Vision? 0 0  Difficulty concentrating or making decisions? 0 1  Comment  some trouble with memory  Walking or climbing stairs? 0 0  Dressing or bathing? 0 0  Doing errands, shopping? 0 0  Preparing Food and eating ? N N  Using the Toilet? N N  In the past six months, have you accidently leaked urine? N N  Do you have problems with loss of bowel control? N N  Managing your Medications? N N  Managing your Finances? N N  Housekeeping or managing  your Housekeeping? N N    Patient Care Team: Ronnald Nian, MD as PCP - General (Family Medicine)  Indicate any recent Medical Services you may have received from other than [facility] providers in the past year (date may be approximate).     Assessment:   This is a routine wellness examination for Barbara Cunningham.  Hearing/Vision screen Vision Screening - Comments:: Regular eye exams, Dr. Dione Booze  Dietary issues and exercise activities discussed:     Goals Addressed             This Visit's Progress  Patient Stated       01/19/2023, wants to lose weight       Depression Screen    01/19/2023   12:08 PM 03/20/2022   10:01 AM 03/18/2021    8:41 AM 03/14/2020    1:55 PM 02/07/2018   11:34 AM 07/13/2016    2:14 PM  PHQ 2/9 Scores  PHQ - 2 Score 0 0 0 0 0 0  PHQ- 9 Score 0 3        Fall Risk    01/19/2023   12:07 PM 09/02/2022    1:40 PM 03/20/2022   10:00 AM 03/18/2021    8:40 AM 03/14/2020    1:54 PM  Fall Risk   Falls in the past year? 0 0 1 0 0  Comment   tripped in yard    Number falls in past yr: 0 0 0 0   Injury with Fall? 0 0 0 0   Risk for fall due to : Medication side effect No Fall Risks Medication side effect No Fall Risks No Fall Risks  Follow up Falls prevention discussed;Education provided;Falls evaluation completed Falls evaluation completed Falls evaluation completed;Education provided;Falls prevention discussed Falls evaluation completed     MEDICARE RISK AT HOME:  Medicare Risk at Home - 01/19/23 1207     Any stairs in or around the home? Yes    If so, are there any without handrails? No    Home free of loose throw rugs in walkways, pet beds, electrical cords, etc? Yes    Adequate lighting in your home to reduce risk of falls? Yes    Life alert? No    Use of a cane, walker or w/c? No    Grab bars in the bathroom? Yes    Shower chair or bench in shower? Yes    Elevated toilet seat or a handicapped toilet? Yes             TIMED UP AND GO:  Was the  test performed?  No    Cognitive Function:        01/19/2023   12:08 PM 03/20/2022   10:03 AM  6CIT Screen  What Year? 0 points 0 points  What month? 0 points 0 points  What time? 0 points 0 points  Count back from 20 0 points 0 points  Months in reverse 0 points 0 points  Repeat phrase 0 points 0 points  Total Score 0 points 0 points    Immunizations Immunization History  Administered Date(s) Administered   Fluad Quad(high Dose 65+) 04/14/2019, 06/03/2020   Influenza-Unspecified 06/06/2021, 04/13/2022   PFIZER(Purple Top)SARS-COV-2 Vaccination 09/08/2019, 10/03/2019, 12/20/2020, 04/08/2021   Pneumococcal Conjugate-13 04/14/2019   Pneumococcal Polysaccharide-23 10/24/2010   Tdap 10/24/2010, 03/27/2020   Zoster Recombinat (Shingrix) 03/27/2020, 05/22/2020, 08/22/2020   Zoster, Live 10/24/2010    TDAP status: Up to date  Flu Vaccine status: Up to date  Pneumococcal vaccine status: Up to date  Covid-19 vaccine status: Completed vaccines  Qualifies for Shingles Vaccine? Yes   Zostavax completed Yes   Shingrix Completed?: Yes  Screening Tests Health Maintenance  Topic Date Due   COVID-19 Vaccine (5 - 2023-24 season) 03/27/2022   INFLUENZA VACCINE  02/25/2023   Medicare Annual Wellness (AWV)  03/21/2023   MAMMOGRAM  05/10/2023   Colonoscopy  05/12/2026   DTaP/Tdap/Td (3 - Td or Tdap) 03/27/2030   DEXA SCAN  Completed   Hepatitis C Screening  Completed   Zoster Vaccines- Shingrix  Completed  HPV VACCINES  Aged Out   Pneumonia Vaccine 18+ Years old  Discontinued   Fecal DNA (Cologuard)  Discontinued    Health Maintenance  Health Maintenance Due  Topic Date Due   COVID-19 Vaccine (5 - 2023-24 season) 03/27/2022    Colorectal cancer screening: Type of screening: Colonoscopy. Completed 05/12/2021. Repeat every 5 years  Mammogram status: Completed 05/09/2021. Repeat every 2 years  Bone Density status: Completed 07/08/2020.   Lung Cancer Screening: (Low  Dose CT Chest recommended if Age 55-80 years, 20 pack-year currently smoking OR have quit w/in 15years.) does not qualify.   Lung Cancer Screening Referral: no  Additional Screening:  Hepatitis C Screening: does qualify; Completed 03/25/2022  Vision Screening: Recommended annual ophthalmology exams for early detection of glaucoma and other disorders of the eye. Is the patient up to date with their annual eye exam?  Yes  Who is the provider or what is the name of the office in which the patient attends annual eye exams? [facility] If pt is not established with a provider, would they like to be referred to a provider to establish care? No .   Dental Screening: Recommended annual dental exams for proper oral hygiene  Diabetic Foot Exam: n/a  Community Resource Referral / Chronic Care Management: CRR required this visit?  No   CCM required this visit?  No     Plan:     I have personally reviewed and noted the following in the patient's chart:   Medical and social history Use of alcohol, tobacco or illicit drugs  Current medications and supplements including opioid prescriptions. Patient is currently taking opioid prescriptions. Information provided to patient regarding non-opioid alternatives. Patient advised to discuss non-opioid treatment plan with their provider. Functional ability and status Nutritional status Physical activity Advanced directives List of other physicians Hospitalizations, surgeries, and ER visits in previous 12 months Vitals Screenings to include cognitive, depression, and falls Referrals and appointments  In addition, I have reviewed and discussed with patient certain preventive protocols, quality metrics, and best practice recommendations. A written personalized care plan for preventive services as well as general preventive health recommendations were provided to patient.     Barb Merino, LPN   [id number]   After Visit Summary: (MyChart) Due  to this being a telephonic visit, the after visit summary with patients personalized plan was offered to patient via MyChart   Nurse Notes: none

## 2023-01-19 NOTE — Patient Instructions (Signed)
[name] , Thank you for taking time to come for your Medicare Wellness Visit. I appreciate your ongoing commitment to your health goals. Please review the following plan we discussed and let me know if I can assist you in the future.   These are the goals we discussed:  Goals      Patient Stated     03/20/2022, wants to lose weight     Patient Stated     01/19/2023, wants to lose weight        This is a list of the screening recommended for you and due dates:  Health Maintenance  Topic Date Due   COVID-19 Vaccine (5 - 2023-24 season) 03/27/2022   Flu Shot  02/25/2023   Mammogram  05/10/2023   Medicare Annual Wellness Visit  01/19/2024   Colon Cancer Screening  05/12/2026   DTaP/Tdap/Td vaccine (3 - Td or Tdap) 03/27/2030   DEXA scan (bone density measurement)  Completed   Hepatitis C Screening  Completed   Zoster (Shingles) Vaccine  Completed   HPV Vaccine  Aged Out   Pneumonia Vaccine  Discontinued   Cologuard (Stool DNA test)  Discontinued    Advanced directives: Please bring a copy of your POA (Power of Attorney) and/or Living Will to your next appointment.   Conditions/risks identified: none  Next appointment: Follow up in one year for your annual wellness visit    Preventive Care 65 Years and Older, Female Preventive care refers to lifestyle choices and visits with your health care provider that can promote health and wellness. What does preventive care include? A yearly physical exam. This is also called an annual well check. Dental exams once or twice a year. Routine eye exams. Ask your health care provider how often you should have your eyes checked. Personal lifestyle choices, including: Daily care of your teeth and gums. Regular physical activity. Eating a healthy diet. Avoiding tobacco and drug use. Limiting alcohol use. Practicing safe sex. Taking low-dose aspirin every day. Taking vitamin and mineral supplements as recommended by your health care  provider. What happens during an annual well check? The services and screenings done by your health care provider during your annual well check will depend on your age, overall health, lifestyle risk factors, and family history of disease. Counseling  Your health care provider may ask you questions about your: Alcohol use. Tobacco use. Drug use. Emotional well-being. Home and relationship well-being. Sexual activity. Eating habits. History of falls. Memory and ability to understand (cognition). Work and work Astronomer. Reproductive health. Screening  You may have the following tests or measurements: Height, weight, and BMI. Blood pressure. Lipid and cholesterol levels. These may be checked every 5 years, or more frequently if you are over 24 years old. Skin check. Lung cancer screening. You may have this screening every year starting at age 62 if you have a 30-pack-year history of smoking and currently smoke or have quit within the past 15 years. Fecal occult blood test (FOBT) of the stool. You may have this test every year starting at age 72. Flexible sigmoidoscopy or colonoscopy. You may have a sigmoidoscopy every 5 years or a colonoscopy every 10 years starting at age 19. Hepatitis C blood test. Hepatitis B blood test. Sexually transmitted disease (STD) testing. Diabetes screening. This is done by checking your blood sugar (glucose) after you have not eaten for a while (fasting). You may have this done every 1-3 years. Bone density scan. This is done to screen for osteoporosis.  You may have this done starting at age 5. Mammogram. This may be done every 1-2 years. Talk to your health care provider about how often you should have regular mammograms. Talk with your health care provider about your test results, treatment options, and if necessary, the need for more tests. Vaccines  Your health care provider may recommend certain vaccines, such as: Influenza vaccine. This is  recommended every year. Tetanus, diphtheria, and acellular pertussis (Tdap, Td) vaccine. You may need a Td booster every 10 years. Zoster vaccine. You may need this after age 59. Pneumococcal 13-valent conjugate (PCV13) vaccine. One dose is recommended after age 58. Pneumococcal polysaccharide (PPSV23) vaccine. One dose is recommended after age 71. Talk to your health care provider about which screenings and vaccines you need and how often you need them. This information is not intended to replace advice given to you by your health care provider. Make sure you discuss any questions you have with your health care provider. Document Released: 08/09/2015 Document Revised: 04/01/2016 Document Reviewed: 05/14/2015 Elsevier Interactive Patient Education  2017 [place] Prevention in the Home Falls can cause injuries. They can happen to people of all ages. There are many things you can do to make your home safe and to help prevent falls. What can I do on the outside of my home? Regularly fix the edges of walkways and driveways and fix any cracks. Remove anything that might make you trip as you walk through a door, such as a raised step or threshold. Trim any bushes or trees on the path to your home. Use bright outdoor lighting. Clear any walking paths of anything that might make someone trip, such as rocks or tools. Regularly check to see if handrails are loose or broken. Make sure that both sides of any steps have handrails. Any raised decks and porches should have guardrails on the edges. Have any leaves, snow, or ice cleared regularly. Use sand or salt on walking paths during winter. Clean up any spills in your garage right away. This includes oil or grease spills. What can I do in the bathroom? Use night lights. Install grab bars by the toilet and in the tub and shower. Do not use towel bars as grab bars. Use non-skid mats or decals in the tub or shower. If you need to sit down in  the shower, use a plastic, non-slip stool. Keep the floor dry. Clean up any water that spills on the floor as soon as it happens. Remove soap buildup in the tub or shower regularly. Attach bath mats securely with double-sided non-slip rug tape. Do not have throw rugs and other things on the floor that can make you trip. What can I do in the bedroom? Use night lights. Make sure that you have a light by your bed that is easy to reach. Do not use any sheets or blankets that are too big for your bed. They should not hang down onto the floor. Have a firm chair that has side arms. You can use this for support while you get dressed. Do not have throw rugs and other things on the floor that can make you trip. What can I do in the kitchen? Clean up any spills right away. Avoid walking on wet floors. Keep items that you use a lot in easy-to-reach places. If you need to reach something above you, use a strong step stool that has a grab bar. Keep electrical cords out of the way. Do not use  floor polish or wax that makes floors slippery. If you must use wax, use non-skid floor wax. Do not have throw rugs and other things on the floor that can make you trip. What can I do with my stairs? Do not leave any items on the stairs. Make sure that there are handrails on both sides of the stairs and use them. Fix handrails that are broken or loose. Make sure that handrails are as long as the stairways. Check any carpeting to make sure that it is firmly attached to the stairs. Fix any carpet that is loose or worn. Avoid having throw rugs at the top or bottom of the stairs. If you do have throw rugs, attach them to the floor with carpet tape. Make sure that you have a light switch at the top of the stairs and the bottom of the stairs. If you do not have them, ask someone to add them for you. What else can I do to help prevent falls? Wear shoes that: Do not have high heels. Have rubber bottoms. Are comfortable  and fit you well. Are closed at the toe. Do not wear sandals. If you use a stepladder: Make sure that it is fully opened. Do not climb a closed stepladder. Make sure that both sides of the stepladder are locked into place. Ask someone to hold it for you, if possible. Clearly mark and make sure that you can see: Any grab bars or handrails. First and last steps. Where the edge of each step is. Use tools that help you move around (mobility aids) if they are needed. These include: Canes. Walkers. Scooters. Crutches. Turn on the lights when you go into a dark area. Replace any light bulbs as soon as they burn out. Set up your furniture so you have a clear path. Avoid moving your furniture around. If any of your floors are uneven, fix them. If there are any pets around you, be aware of where they are. Review your medicines with your doctor. Some medicines can make you feel dizzy. This can increase your chance of falling. Ask your doctor what other things that you can do to help prevent falls. This information is not intended to replace advice given to you by your health care provider. Make sure you discuss any questions you have with your health care provider. Document Released: 05/09/2009 Document Revised: 12/19/2015 Document Reviewed: 08/17/2014 Elsevier Interactive Patient Education  2017 [employer]   Managing Pain Without Opioids Opioids are strong medicines used to treat moderate to severe pain. For some people, especially those who have long-term (chronic) pain, opioids may not be the best choice for pain management due to: Side effects like nausea, constipation, and sleepiness. The risk of addiction (opioid use disorder). The longer you take opioids, the greater your risk of addiction. Pain that lasts for more than 3 months is called chronic pain. Managing chronic pain usually requires more than one approach and is often provided by a team of health care providers working together  (multidisciplinary approach). Pain management may be done at a pain management center or pain clinic. How to manage pain without the use of opioids Use non-opioid medicines Non-opioid medicines for pain may include: Over-the-counter or prescription non-steroidal anti-inflammatory drugs (NSAIDs). These may be the first medicines used for pain. They work well for muscle and bone pain, and they reduce swelling. Acetaminophen. This over-the-counter medicine may work well for milder pain but not swelling. Antidepressants. These may be used to treat chronic pain.  A certain type of antidepressant (tricyclics) is often used. These medicines are given in lower doses for pain than when used for depression. Anticonvulsants. These are usually used to treat seizures but may also reduce nerve (neuropathic) pain. Muscle relaxants. These relieve pain caused by sudden muscle tightening (spasms). You may also use a pain medicine that is applied to the skin as a patch, cream, or gel (topical analgesic), such as a numbing medicine. These may cause fewer side effects than medicines taken by mouth. Do certain therapies as directed Some therapies can help with pain management. They include: Physical therapy. You will do exercises to gain strength and flexibility. A physical therapist may teach you exercises to move and stretch parts of your body that are weak, stiff, or painful. You can learn these exercises at physical therapy visits and practice them at home. Physical therapy may also involve: Massage. Heat wraps or applying heat or cold to affected areas. Electrical signals that interrupt pain signals (transcutaneous electrical nerve stimulation, TENS). Weak lasers that reduce pain and swelling (low-level laser therapy). Signals from your body that help you learn to regulate pain (biofeedback). Occupational therapy. This helps you to learn ways to function at home and work with less pain. Recreational therapy. This  involves trying new activities or hobbies, such as a physical activity or drawing. Mental health therapy, including: Cognitive behavioral therapy (CBT). This helps you learn coping skills for dealing with pain. Acceptance and commitment therapy (ACT) to change the way you think and react to pain. Relaxation therapies, including muscle relaxation exercises and mindfulness-based stress reduction. Pain management counseling. This may be individual, family, or group counseling.  Receive medical treatments Medical treatments for pain management include: Nerve block injections. These may include a pain blocker and anti-inflammatory medicines. You may have injections: Near the spine to relieve chronic back or neck pain. Into joints to relieve back or joint pain. Into nerve areas that supply a painful area to relieve body pain. Into muscles (trigger point injections) to relieve some painful muscle conditions. A medical device placed near your spine to help block pain signals and relieve nerve pain or chronic back pain (spinal cord stimulation device). Acupuncture. Follow these instructions at home Medicines Take over-the-counter and prescription medicines only as told by your health care provider. If you are taking pain medicine, ask your health care providers about possible side effects to watch out for. Do not drive or use heavy machinery while taking prescription opioid pain medicine. Lifestyle  Do not use drugs or alcohol to reduce pain. If you drink alcohol, limit how much you have to: 0-1 drink a day for women who are not pregnant. 0-2 drinks a day for men. Know how much alcohol is in a drink. In the [place] one drink equals one 12 oz bottle of beer (355 mL), one 5 oz glass of wine (148 mL), or one 1 oz glass of hard liquor (44 mL). Do not use any products that contain nicotine or tobacco. These products include cigarettes, chewing tobacco, and vaping devices, such as e-cigarettes. If you  need help quitting, ask your health care provider. Eat a healthy diet and maintain a healthy weight. Poor diet and excess weight may make pain worse. Eat foods that are high in fiber. These include fresh fruits and vegetables, whole grains, and beans. Limit foods that are high in fat and processed sugars, such as fried and sweet foods. Exercise regularly. Exercise lowers stress and may help relieve pain. Ask your health  care provider what activities and exercises are safe for you. If your health care provider approves, join an exercise class that combines movement and stress reduction. Examples include yoga and tai chi. Get enough sleep. Lack of sleep may make pain worse. Lower stress as much as possible. Practice stress reduction techniques as told by your therapist. General instructions Work with all your pain management providers to find the treatments that work best for you. You are an important member of your pain management team. There are many things you can do to reduce pain on your own. Consider joining an online or in-person support group for people who have chronic pain. Keep all follow-up visits. This is important. Where to find more information You can find more information about managing pain without opioids from: American Academy of Pain Medicine: painmed.org Institute for Chronic Pain: instituteforchronicpain.org American Chronic Pain Association: theacpa.org Contact a health care provider if: You have side effects from pain medicine. Your pain gets worse or does not get better with treatments or home therapy. You are struggling with anxiety or depression. Summary Many types of pain can be managed without opioids. Chronic pain may respond better to pain management without opioids. Pain is best managed when you and a team of health care providers work together. Pain management without opioids may include non-opioid medicines, medical treatments, physical therapy, mental health  therapy, and lifestyle changes. Tell your health care providers if your pain gets worse or is not being managed well enough. This information is not intended to replace advice given to you by your health care provider. Make sure you discuss any questions you have with your health care provider. Document Revised: 10/23/2020 Document Reviewed: 10/23/2020 Elsevier Patient Education  2024 [employer].

## 2023-03-31 ENCOUNTER — Other Ambulatory Visit: Payer: Self-pay | Admitting: Nurse Practitioner

## 2023-03-31 ENCOUNTER — Other Ambulatory Visit: Payer: Self-pay | Admitting: Family Medicine

## 2023-03-31 DIAGNOSIS — K7469 Other cirrhosis of liver: Secondary | ICD-10-CM

## 2023-03-31 DIAGNOSIS — F909 Attention-deficit hyperactivity disorder, unspecified type: Secondary | ICD-10-CM

## 2023-03-31 DIAGNOSIS — N200 Calculus of kidney: Secondary | ICD-10-CM

## 2023-03-31 MED ORDER — HYDROCODONE-ACETAMINOPHEN 5-325 MG PO TABS: 1.0000 | ORAL_TABLET | Freq: Four times a day (QID) | ORAL | 0 refills | Status: DC | PRN

## 2023-03-31 MED ORDER — AMPHETAMINE-DEXTROAMPHETAMINE 20 MG PO TABS: 20.0000 mg | ORAL_TABLET | Freq: Two times a day (BID) | ORAL | 0 refills | Status: DC

## 2023-04-01 ENCOUNTER — Encounter: Payer: Self-pay | Admitting: Family Medicine

## 2023-04-01 ENCOUNTER — Ambulatory Visit (INDEPENDENT_AMBULATORY_CARE_PROVIDER_SITE_OTHER): Payer: Medicare Other | Admitting: Family Medicine

## 2023-04-01 VITALS — BP 128/84 | HR 88 | Ht 66.0 in | Wt 244.8 lb

## 2023-04-01 DIAGNOSIS — K529 Noninfective gastroenteritis and colitis, unspecified: Secondary | ICD-10-CM

## 2023-04-01 NOTE — Progress Notes (Signed)
   Subjective:    Patient ID: Barbara Cunningham, female    DOB: 04/07/1951, 72 y.o.   MRN: [id number]  HPI Approximately 10 days ago she had an episode of nausea, vomiting and diarrhea but no fever, chills, other systemic symptoms.  Everything cleared up except the diarrhea which has been intermittent since then.   Review of Systems     Objective:    Physical Exam Alert and in no distress.  Abdominal exam shows no masses or tenderness with decreased bowel sounds       Assessment & Plan:  Gastroenteritis  Need for influenza vaccination - Plan: CANCELED: Flu Vaccine Trivalent High Dose (Fluad) Start on the probiotics and you can also use like Dannon yogurt things like that.  What you might also want to do is to get on a stool prep of like MiraLAX.

## 2023-04-01 NOTE — Patient Instructions (Addendum)
Start on the probiotics and you can also use like Dannon yogurt things like that.  What you might also want to do is to get on a stool prep of like MiraLAX.

## 2023-04-05 ENCOUNTER — Ambulatory Visit
Admission: RE | Admit: 2023-04-05 | Discharge: 2023-04-05 | Disposition: A | Discharge status: Home / Self Care | Payer: Medicare Other | Source: Ambulatory Visit | Attending: Nurse Practitioner | Admitting: Nurse Practitioner

## 2023-04-05 ENCOUNTER — Other Ambulatory Visit: Payer: Medicare Other

## 2023-04-05 DIAGNOSIS — K7460 Unspecified cirrhosis of liver: Secondary | ICD-10-CM | POA: Diagnosis not present

## 2023-04-05 DIAGNOSIS — K7469 Other cirrhosis of liver: Secondary | ICD-10-CM

## 2023-04-05 DIAGNOSIS — K8020 Calculus of gallbladder without cholecystitis without obstruction: Secondary | ICD-10-CM | POA: Diagnosis not present

## 2023-04-08 DIAGNOSIS — L905 Scar conditions and fibrosis of skin: Secondary | ICD-10-CM | POA: Diagnosis not present

## 2023-04-08 DIAGNOSIS — D485 Neoplasm of uncertain behavior of skin: Secondary | ICD-10-CM | POA: Diagnosis not present

## 2023-04-08 DIAGNOSIS — L304 Erythema intertrigo: Secondary | ICD-10-CM | POA: Diagnosis not present

## 2023-04-08 DIAGNOSIS — D1801 Hemangioma of skin and subcutaneous tissue: Secondary | ICD-10-CM | POA: Diagnosis not present

## 2023-04-08 DIAGNOSIS — L719 Rosacea, unspecified: Secondary | ICD-10-CM | POA: Diagnosis not present

## 2023-04-08 DIAGNOSIS — L814 Other melanin hyperpigmentation: Secondary | ICD-10-CM | POA: Diagnosis not present

## 2023-04-08 DIAGNOSIS — L570 Actinic keratosis: Secondary | ICD-10-CM | POA: Diagnosis not present

## 2023-04-08 DIAGNOSIS — D229 Melanocytic nevi, unspecified: Secondary | ICD-10-CM | POA: Diagnosis not present

## 2023-05-24 ENCOUNTER — Encounter: Payer: Self-pay | Admitting: Family Medicine

## 2023-05-27 ENCOUNTER — Encounter: Payer: Self-pay | Admitting: Family Medicine

## 2023-05-27 ENCOUNTER — Ambulatory Visit: Payer: Medicare Other | Admitting: Family Medicine

## 2023-05-27 VITALS — BP 118/80 | HR 76 | Temp 97.9°F | Wt 237.6 lb

## 2023-05-27 DIAGNOSIS — J301 Allergic rhinitis due to pollen: Secondary | ICD-10-CM

## 2023-05-27 DIAGNOSIS — R42 Dizziness and giddiness: Secondary | ICD-10-CM

## 2023-05-27 MED ORDER — MECLIZINE HCL 12.5 MG PO TABS: 12.5000 mg | ORAL_TABLET | Freq: Three times a day (TID) | ORAL | 0 refills | Status: AC | PRN

## 2023-05-27 NOTE — Progress Notes (Signed)
   Subjective:    Patient ID: Barbara Cunningham, female    DOB: 07-17-1951, 72 y.o.   MRN: [id number]  HPI She is here for evaluation of dizziness.  Apparently she had an episode of this while in [place] and was seen in an urgent care center.  He did an EKG which was negative.  They gave her Antivert and recommend she take Claritin.  She also has questions about her left ear having drainage from it.  Presently she is having some rhinorrhea, sinus and nasal congestion, itchy watery eyes and sneezing.   Review of Systems     Objective:    Physical Exam Alert and in no distress.  EOMI.  Other cranial nerves grossly intact.  Normal cerebellar tympanic membranes and canals are normal. Pharyngeal area is normal. Neck is supple without adenopathy or thyromegaly. Cardiac exam shows a regular sinus rhythm without murmurs or gallops. Lungs are clear to auscultation.        Assessment & Plan:  Dizziness - Plan: meclizine (ANTIVERT) 12.5 MG tablet  Seasonal allergic rhinitis due to pollen I explained that I thought this was all interconnected with her sinuses send recommend Antivert as needed as well as Claritin and recommend Flonase as well.  She will keep me informed as to how she is doing.

## 2023-06-04 ENCOUNTER — Other Ambulatory Visit: Payer: Self-pay | Admitting: Family Medicine

## 2023-06-04 DIAGNOSIS — F909 Attention-deficit hyperactivity disorder, unspecified type: Secondary | ICD-10-CM

## 2023-06-07 MED ORDER — AMPHETAMINE-DEXTROAMPHETAMINE 20 MG PO TABS: 20.0000 mg | ORAL_TABLET | Freq: Two times a day (BID) | ORAL | 0 refills | Status: DC

## 2023-06-07 NOTE — Telephone Encounter (Signed)
Last apt 05/27/23.

## 2023-06-11 ENCOUNTER — Other Ambulatory Visit: Payer: Self-pay | Admitting: Family Medicine

## 2023-06-11 DIAGNOSIS — I10 Essential (primary) hypertension: Secondary | ICD-10-CM

## 2023-07-06 ENCOUNTER — Ambulatory Visit: Payer: Medicare Other | Admitting: Family Medicine

## 2023-08-17 ENCOUNTER — Encounter: Payer: Self-pay | Admitting: Family Medicine

## 2023-08-17 ENCOUNTER — Ambulatory Visit: Payer: Medicare Other | Admitting: Family Medicine

## 2023-08-17 VITALS — BP 122/82 | HR 3 | Ht 66.0 in | Wt 239.0 lb

## 2023-08-17 DIAGNOSIS — Z6379 Other stressful life events affecting family and household: Secondary | ICD-10-CM

## 2023-08-17 DIAGNOSIS — F902 Attention-deficit hyperactivity disorder, combined type: Secondary | ICD-10-CM

## 2023-08-17 DIAGNOSIS — K7460 Unspecified cirrhosis of liver: Secondary | ICD-10-CM | POA: Diagnosis not present

## 2023-08-17 DIAGNOSIS — Z13220 Encounter for screening for lipoid disorders: Secondary | ICD-10-CM

## 2023-08-17 DIAGNOSIS — M25551 Pain in right hip: Secondary | ICD-10-CM | POA: Diagnosis not present

## 2023-08-17 DIAGNOSIS — H6122 Impacted cerumen, left ear: Secondary | ICD-10-CM

## 2023-08-17 DIAGNOSIS — D126 Benign neoplasm of colon, unspecified: Secondary | ICD-10-CM | POA: Diagnosis not present

## 2023-08-17 DIAGNOSIS — Z8619 Personal history of other infectious and parasitic diseases: Secondary | ICD-10-CM

## 2023-08-17 DIAGNOSIS — Z23 Encounter for immunization: Secondary | ICD-10-CM

## 2023-08-17 DIAGNOSIS — Z87442 Personal history of urinary calculi: Secondary | ICD-10-CM

## 2023-08-17 DIAGNOSIS — I10 Essential (primary) hypertension: Secondary | ICD-10-CM | POA: Diagnosis not present

## 2023-08-17 DIAGNOSIS — Z1231 Encounter for screening mammogram for malignant neoplasm of breast: Secondary | ICD-10-CM

## 2023-08-17 DIAGNOSIS — L989 Disorder of the skin and subcutaneous tissue, unspecified: Secondary | ICD-10-CM | POA: Diagnosis not present

## 2023-08-17 DIAGNOSIS — Z0000 Encounter for general adult medical examination without abnormal findings: Secondary | ICD-10-CM

## 2023-08-17 DIAGNOSIS — F909 Attention-deficit hyperactivity disorder, unspecified type: Secondary | ICD-10-CM

## 2023-08-17 DIAGNOSIS — N6459 Other signs and symptoms in breast: Secondary | ICD-10-CM | POA: Diagnosis not present

## 2023-08-17 LAB — LIPID PANEL

## 2023-08-17 MED ORDER — LISINOPRIL 10 MG PO TABS: 10.0000 mg | ORAL_TABLET | Freq: Every day | ORAL | 0 refills | Status: DC

## 2023-08-17 MED ORDER — AMPHETAMINE-DEXTROAMPHETAMINE 20 MG PO TABS: 20.0000 mg | ORAL_TABLET | Freq: Two times a day (BID) | ORAL | 0 refills | Status: DC

## 2023-08-17 NOTE — Patient Instructions (Signed)
Try Lamisil sparingly daily for the next 3 weeks see a dermatologist if it has no effect

## 2023-08-17 NOTE — Progress Notes (Signed)
Complete physical exam  Patient: Barbara Cunningham   DOB: 1951-06-29   73 y.o. Female  MRN: [id number]  Subjective:      Barbara Cunningham is a 73 y.o. female who presents today for a complete physical exam.  She reports consuming a general diet.  None.  She generally feels well. She reports sleeping well.  She has been going through a lot of stress dealing with an uncle that died recently which required multiple trips back and forth to the [facility].  Also her 34 year old mother recently died.  She seems to be handling this fairly well.  She continues to have intermittent back pain and has had a previous workup on this which was essentially negative.  She occasionally does take Tylenol with codeine which does help.  Now she is also describing in the right thigh pain that she has had for the last month.  She points to the mid thigh area and also states that she cannot lay on her right side.  She states that motion does not make much of a difference but then would describe it position as a problem.  No numbness, tingling or weakness.  She also complains of earwax impaction on the left.  Apparently she has been using Q-tips to try and clean it out.  She does have underlying ADHD and does use Adderall more or less on an as-needed basis.  1 pill will last roughly 6 hours.  She uses the medicine on an as-needed basis.  Since she does not take it twice per day, I will switch her to once a day since she uses it as needed.  She continues on lisinopril for her blood pressure.  She does have a mammogram set up.  Assess for breast cancer.  She does have a history of hepatitis C as well as some cirrhosis followed up yearly by the specialist.  She also has a history of tubular adenoma and is set up for routine surveillance of that.  She also has a reddish lesion in the mid upper chest area that she has concerns about.  Most recent fall risk assessment:    08/17/2023   11:22 AM  Fall Risk   Falls in the past year?  0  Number falls in past yr: 0  Injury with Fall? 0  Risk for fall due to : No Fall Risks  Follow up Falls evaluation completed     Most recent depression screenings:    08/17/2023   11:22 AM 01/19/2023   12:08 PM  PHQ 2/9 Scores  PHQ - 2 Score 0 0  PHQ- 9 Score  0    Vision:Within last year    Immunization History  Administered Date(s) Administered   Fluad Quad(high Dose 65+) 04/14/2019, 06/03/2020   Fluad Trivalent(High Dose 65+) 08/17/2023   Influenza-Unspecified 06/06/2021, 04/13/2022   PFIZER(Purple Top)SARS-COV-2 Vaccination 09/08/2019, 10/03/2019, 12/20/2020, 04/08/2021   Pneumococcal Conjugate-13 04/14/2019   Pneumococcal Polysaccharide-23 10/24/2010   Tdap 10/24/2010, 03/27/2020   Zoster Recombinant(Shingrix) 03/27/2020, 05/22/2020, 08/22/2020   Zoster, Live 10/24/2010    Health Maintenance  Topic Date Due   COVID-19 Vaccine (5 - 2024-25 season) 03/28/2023   MAMMOGRAM  05/10/2023   Medicare Annual Wellness (AWV)  01/19/2024   Colonoscopy  05/12/2026   DTaP/Tdap/Td (3 - Td or Tdap) 03/27/2030   INFLUENZA VACCINE  Completed   DEXA SCAN  Completed   Hepatitis C Screening  Completed   Zoster Vaccines- Shingrix  Completed   HPV VACCINES  Aged Out   Pneumonia Vaccine 43+ Years old  Discontinued   Fecal DNA (Cologuard)  Discontinued    Patient Care Team: Ronnald Nian, MD as PCP - General (Family Medicine)  Optho-Dr Zetta Bills Dentist-[facility] GI-Dr.Mann Ortho-Dr. Deboraha Sprang) Gramig(wrist)   Outpatient Medications Prior to Visit  Medication Sig Note   amphetamine-dextroamphetamine (ADDERALL) 20 MG tablet Take 1 tablet (20 mg total) by mouth 2 (two) times daily. 08/17/2023: As needed   cholecalciferol (VITAMIN D3) 25 MCG (1000 UNIT) tablet Take 1,000 Units by mouth daily.    Cyanocobalamin (B-12) 1000 MCG TABS Take 1 tablet by mouth daily.    lisinopril (ZESTRIL) 10 MG tablet TAKE 1 TABLET BY MOUTH DAILY     HYDROcodone-acetaminophen (NORCO) 5-325 MG tablet Take 1 tablet by mouth every 6 (six) hours as needed for moderate pain. (Patient not taking: Reported on 08/17/2023) 08/17/2023: As needed   meclizine (ANTIVERT) 12.5 MG tablet Take 1 tablet (12.5 mg total) by mouth 3 (three) times daily as needed for dizziness. (Patient not taking: Reported on 08/17/2023) 08/17/2023: As needed   triamcinolone cream (KENALOG) 0.1 % Apply 1 application topically 2 (two) times daily. (Patient not taking: Reported on 08/17/2023)    No facility-administered medications prior to visit.    Review of Systems  All other systems reviewed and are negative.   Family and social history as well as health maintenance and immunizations was reviewed.     Objective:    BP 122/82   Pulse (!) 3   Ht 5\' 6"  (1.676 m)   Wt 239 lb (108.4 kg)   SpO2 97%   BMI 38.58 kg/m    Physical Exam  Alert and in no distress.  He cherry-red appearing 1.5 cm lesion is noted on the mid chest area.  It has slightly waxy appearance to its.  Tympanic membrane on the right is normal, left cannot be seen due to sore cerumen.  Canals are normal. Pharyngeal area is normal. Neck is supple without adenopathy or thyromegaly. Cardiac exam shows a regular sinus rhythm without murmurs or gallops. Lungs are clear to auscultation. Exam of the right thigh shows good motion without pain.  No palpable tenderness noted.  DTRs normal.     Assessment & Plan:     Routine general medical examination at a health care facility - Plan: CBC with Differential/Platelet, Comprehensive metabolic panel, Lipid panel  Abnormal breast finding  Attention deficit hyperactivity disorder (ADHD), combined type  Cirrhosis of liver without ascites, unspecified hepatic cirrhosis type (HCC) - Plan: CBC with Differential/Platelet, Comprehensive metabolic panel  History of hepatitis C  Morbid obesity (HCC) - Plan: CBC with Differential/Platelet, Comprehensive metabolic panel,  Lipid panel  Tubular adenoma of colon  Screening for lipid disorders - Plan: Lipid panel  Need for influenza vaccination - Plan: Flu Vaccine Trivalent High Dose (Fluad)  Stress due to illness of family member  Encounter for screening mammogram for malignant neoplasm of breast - Plan: MM Digital Screening  Skin lesion - Plan: Ambulatory referral to Dermatology  Right hip pain - Plan: DG Hip Unilat W OR W/O Pelvis 2-3 Views Right  Hearing loss due to cerumen impaction, left - Plan: Ambulatory referral to ENT    She will continue follow-up with the hepatology clinic.  She is also scheduled for follow-up concerning the tubular adenoma.  Flu shot given. She seems to be handling the stress that she is under fairly well considering the stress of taking care of all the  legal ramifications. I explained that even though she says it is thigh pain I have a feeling that is more from her hip than anything else. I will renew her Adderall but do it daily. I explained that I want her to use the Lamisil regularly for the next couple weeks but if no improvement then she already has an appointment set up with dermatology.  She was comfortable with that. ENT referral to remove the cerumen.   Sharlot Gowda, MD

## 2023-08-18 ENCOUNTER — Encounter: Payer: Self-pay | Admitting: Family Medicine

## 2023-08-18 LAB — CBC WITH DIFFERENTIAL/PLATELET
Basophils Absolute: 0.1 10*3/uL (ref 0.0–0.2)
Basos: 1 %
EOS (ABSOLUTE): 0.2 10*3/uL (ref 0.0–0.4)
Eos: 3 %
Hematocrit: 44.5 % (ref 34.0–46.6)
Hemoglobin: 14.8 g/dL (ref 11.1–15.9)
Immature Grans (Abs): 0 10*3/uL (ref 0.0–0.1)
Immature Granulocytes: 0 %
Lymphocytes Absolute: 1.9 10*3/uL (ref 0.7–3.1)
Lymphs: 27 %
MCH: 29.8 pg (ref 26.6–33.0)
MCHC: 33.3 g/dL (ref 31.5–35.7)
MCV: 90 fL (ref 79–97)
Monocytes Absolute: 0.5 10*3/uL (ref 0.1–0.9)
Monocytes: 8 %
Neutrophils Absolute: 4.3 10*3/uL (ref 1.4–7.0)
Neutrophils: 61 %
Platelets: 232 10*3/uL (ref 150–450)
RBC: 4.96 x10E6/uL (ref 3.77–5.28)
RDW: 12.7 % (ref 11.7–15.4)
WBC: 6.9 10*3/uL (ref 3.4–10.8)

## 2023-08-18 LAB — LIPID PANEL
Cholesterol, Total: 205 mg/dL — ABNORMAL HIGH (ref 100–199)
HDL: 46 mg/dL (ref >39)
LDL CALC COMMENT:: 4.5 ratio — ABNORMAL HIGH (ref 0.0–4.4)
LDL Chol Calc (NIH): 129 mg/dL — ABNORMAL HIGH (ref 0–99)
Triglycerides: 167 mg/dL — ABNORMAL HIGH (ref 0–149)
VLDL Cholesterol Cal: 30 mg/dL (ref 5–40)

## 2023-08-18 LAB — COMPREHENSIVE METABOLIC PANEL
ALT: 11 IU/L (ref 0–32)
AST: 15 IU/L (ref 0–40)
Albumin: 4.2 g/dL (ref 3.8–4.8)
Alkaline Phosphatase: 99 IU/L (ref 44–121)
BUN/Creatinine Ratio: 18 (ref 12–28)
BUN: 17 mg/dL (ref 8–27)
Bilirubin Total: 0.4 mg/dL (ref 0.0–1.2)
CO2: 20 mmol/L (ref 20–29)
Calcium: 9.7 mg/dL (ref 8.7–10.3)
Chloride: 104 mmol/L (ref 96–106)
Creatinine, Ser: 0.92 mg/dL (ref 0.57–1.00)
Globulin, Total: 2.6 g/dL (ref 1.5–4.5)
Glucose: 91 mg/dL (ref 70–99)
Potassium: 5.1 mmol/L (ref 3.5–5.2)
Sodium: 138 mmol/L (ref 134–144)
Total Protein: 6.8 g/dL (ref 6.0–8.5)
eGFR: 65 mL/min/{1.73_m2} (ref >59)

## 2023-09-08 DIAGNOSIS — Z961 Presence of intraocular lens: Secondary | ICD-10-CM | POA: Diagnosis not present

## 2023-09-08 DIAGNOSIS — H43813 Vitreous degeneration, bilateral: Secondary | ICD-10-CM | POA: Diagnosis not present

## 2023-09-08 DIAGNOSIS — H35371 Puckering of macula, right eye: Secondary | ICD-10-CM | POA: Diagnosis not present

## 2023-09-08 DIAGNOSIS — H04123 Dry eye syndrome of bilateral lacrimal glands: Secondary | ICD-10-CM | POA: Diagnosis not present

## 2023-09-08 DIAGNOSIS — H26491 Other secondary cataract, right eye: Secondary | ICD-10-CM | POA: Diagnosis not present

## 2023-09-09 ENCOUNTER — Other Ambulatory Visit: Payer: Self-pay | Admitting: Family Medicine

## 2023-09-09 DIAGNOSIS — I10 Essential (primary) hypertension: Secondary | ICD-10-CM

## 2023-09-10 DIAGNOSIS — H26491 Other secondary cataract, right eye: Secondary | ICD-10-CM | POA: Diagnosis not present

## 2023-09-16 ENCOUNTER — Ambulatory Visit: Payer: Medicare Other

## 2023-09-21 ENCOUNTER — Encounter: Payer: Self-pay | Admitting: Internal Medicine

## 2023-09-29 ENCOUNTER — Ambulatory Visit
Admission: RE | Admit: 2023-09-29 | Discharge: 2023-09-29 | Disposition: A | Discharge status: Home / Self Care | Payer: Medicare Other | Source: Ambulatory Visit | Attending: Family Medicine | Admitting: Family Medicine

## 2023-09-29 DIAGNOSIS — Z1231 Encounter for screening mammogram for malignant neoplasm of breast: Secondary | ICD-10-CM | POA: Diagnosis not present

## 2023-09-30 ENCOUNTER — Encounter (INDEPENDENT_AMBULATORY_CARE_PROVIDER_SITE_OTHER): Payer: Self-pay

## 2023-09-30 ENCOUNTER — Ambulatory Visit (INDEPENDENT_AMBULATORY_CARE_PROVIDER_SITE_OTHER): Payer: Medicare Other | Admitting: Otolaryngology

## 2023-09-30 VITALS — BP 145/82 | HR 76 | Ht 65.0 in | Wt 239.0 lb

## 2023-09-30 DIAGNOSIS — H6122 Impacted cerumen, left ear: Secondary | ICD-10-CM

## 2023-09-30 DIAGNOSIS — H9012 Conductive hearing loss, unilateral, left ear, with unrestricted hearing on the contralateral side: Secondary | ICD-10-CM

## 2023-10-03 DIAGNOSIS — H9012 Conductive hearing loss, unilateral, left ear, with unrestricted hearing on the contralateral side: Secondary | ICD-10-CM | POA: Insufficient documentation

## 2023-10-03 DIAGNOSIS — H6122 Impacted cerumen, left ear: Secondary | ICD-10-CM | POA: Insufficient documentation

## 2023-10-03 NOTE — Progress Notes (Signed)
 Patient ID: Barbara Cunningham, female   DOB: 08-Nov-1950, 73 y.o.   MRN: [id number]  CC: Clogging sensation in the left ear, muffled hearing  HPI:  Barbara Cunningham is a 73 y.o. female who presents today complaining of clogging sensation in her left ear.  He has resulted in muffled hearing.  She denies any significant otalgia, otorrhea, or vertigo.  She has no previous otologic surgery.  She has a history of rhinoplasty in the past.  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 She has never used smokeless tobacco. She reports that she does not drink alcohol and does not use drugs.  Allergies: No Known Allergies  Prior to Admission medications   Medication Sig Start Date End Date Taking? Authorizing Provider  amphetamine-dextroamphetamine (ADDERALL) 20 MG tablet Take 1 tablet (20 mg total) by mouth 2 (two) times daily. 08/17/23  Yes Ronnald Nian, MD  cholecalciferol (VITAMIN D3) 25 MCG (1000 UNIT) tablet Take 1,000 Units by mouth daily.   Yes [provider]  Cyanocobalamin (B-12) 1000 MCG TABS Take 1 tablet by mouth daily.   Yes [provider]  lisinopril (ZESTRIL) 10 MG tablet TAKE 1 TABLET BY MOUTH DAILY 09/09/23  Yes Ronnald Nian, MD  triamcinolone cream (KENALOG) 0.1 % Apply 1 application topically 2 (two) times daily. 04/14/21  Yes Ronnald Nian, MD  HYDROcodone-acetaminophen (NORCO) 5-325 MG tablet Take 1 tablet by mouth every 6 (six) hours as needed for moderate pain. 03/31/23   Ronnald Nian, MD   meclizine (ANTIVERT) 12.5 MG tablet Take 1 tablet (12.5 mg total) by mouth 3 (three) times daily as needed for dizziness. 05/27/23   Ronnald Nian, MD    Blood pressure (!) 145/82, pulse 76, height 5\' 5"  (1.651 m), weight 239 lb (108.4 kg), SpO2 98%. Exam: General: Communicates without difficulty, well nourished, no acute distress. Head: Normocephalic, no evidence injury, no tenderness, facial buttresses intact without stepoff. Face/sinus: No tenderness to palpation and percussion. Facial movement is normal and symmetric. Eyes: PERRL, EOMI. No scleral icterus, conjunctivae clear. Neuro: CN II exam reveals vision grossly intact.  No nystagmus at any point of gaze. Ears: Auricles well formed without lesions.  Left ear cerumen impaction.  The right ear canal and tympanic membrane are normal.  Nose: External evaluation reveals normal support and skin without lesions.  Dorsum is intact.  Anterior rhinoscopy reveals congested mucosa over anterior aspect of inferior turbinates and intact septum.  No purulence noted. Oral:  Oral cavity and oropharynx are intact, symmetric, without erythema or edema.  Mucosa is moist without lesions. Neck: Full range of motion without pain.  There is no significant lymphadenopathy.  No masses palpable.  Thyroid bed within normal limits to palpation.  Parotid glands and submandibular glands equal bilaterally without mass.  Trachea is midline. Neuro:  CN 2-12 grossly intact.   Procedure: Left  ear cerumen disimpaction Anesthesia: None Description: Under the operating microscope, the cerumen is carefully removed with a combination of cerumen currette, alligator forceps, and suction catheters.  After the cerumen is removed, the TMs are noted to be normal.  No mass, erythema, or lesions. The patient tolerated the procedure well.    Assessment: 1.  Left ear cerumen impaction, likely causing transient conductive hearing loss. 2.  After the cerumen disimpaction procedure, both  tympanic membranes and middle ear spaces are noted to be normal.  Plan: 1.  Otomicroscopy with left ear cerumen disimpaction. 2.  The physical exam findings are reviewed with the patient. 3.  The patient is instructed not to use Q-tips to clean the ear canals. 4.  The patient is encouraged to call with any questions or concerns.  Amarius Toto W Nakina Spatz 10/03/2023, 2:50 PM

## 2023-10-04 ENCOUNTER — Other Ambulatory Visit: Payer: Self-pay | Admitting: Nurse Practitioner

## 2023-10-04 DIAGNOSIS — K7469 Other cirrhosis of liver: Secondary | ICD-10-CM | POA: Diagnosis not present

## 2023-10-11 ENCOUNTER — Ambulatory Visit
Admission: RE | Admit: 2023-10-11 | Discharge: 2023-10-11 | Disposition: A | Discharge status: Home / Self Care | Source: Ambulatory Visit | Attending: Nurse Practitioner

## 2023-10-11 DIAGNOSIS — K7460 Unspecified cirrhosis of liver: Secondary | ICD-10-CM | POA: Diagnosis not present

## 2023-10-11 DIAGNOSIS — K7469 Other cirrhosis of liver: Secondary | ICD-10-CM

## 2023-10-11 DIAGNOSIS — K8020 Calculus of gallbladder without cholecystitis without obstruction: Secondary | ICD-10-CM | POA: Diagnosis not present

## 2023-11-01 ENCOUNTER — Other Ambulatory Visit: Payer: Self-pay | Admitting: Family Medicine

## 2023-11-01 DIAGNOSIS — N200 Calculus of kidney: Secondary | ICD-10-CM

## 2023-11-01 DIAGNOSIS — F909 Attention-deficit hyperactivity disorder, unspecified type: Secondary | ICD-10-CM

## 2023-11-01 NOTE — Telephone Encounter (Signed)
 Last apt 08/17/23.

## 2023-11-02 MED ORDER — AMPHETAMINE-DEXTROAMPHETAMINE 20 MG PO TABS: 20.0000 mg | ORAL_TABLET | Freq: Two times a day (BID) | ORAL | 0 refills | Status: DC

## 2023-11-02 MED ORDER — HYDROCODONE-ACETAMINOPHEN 5-325 MG PO TABS: 1.0000 | ORAL_TABLET | Freq: Four times a day (QID) | ORAL | 0 refills | Status: DC | PRN

## 2023-11-07 DIAGNOSIS — M179 Osteoarthritis of knee, unspecified: Secondary | ICD-10-CM | POA: Diagnosis not present

## 2023-11-07 DIAGNOSIS — I8013 Phlebitis and thrombophlebitis of femoral vein, bilateral: Secondary | ICD-10-CM | POA: Diagnosis not present

## 2023-11-07 DIAGNOSIS — M7989 Other specified soft tissue disorders: Secondary | ICD-10-CM | POA: Diagnosis not present

## 2023-12-06 DIAGNOSIS — M25561 Pain in right knee: Secondary | ICD-10-CM | POA: Diagnosis not present

## 2024-02-03 ENCOUNTER — Other Ambulatory Visit: Payer: Self-pay | Admitting: Family Medicine

## 2024-02-03 DIAGNOSIS — F909 Attention-deficit hyperactivity disorder, unspecified type: Secondary | ICD-10-CM

## 2024-02-03 DIAGNOSIS — N200 Calculus of kidney: Secondary | ICD-10-CM

## 2024-02-03 MED ORDER — HYDROCODONE-ACETAMINOPHEN 5-325 MG PO TABS: 1.0000 | ORAL_TABLET | Freq: Four times a day (QID) | ORAL | 0 refills | Status: DC | PRN

## 2024-02-03 MED ORDER — AMPHETAMINE-DEXTROAMPHETAMINE 20 MG PO TABS: 20.0000 mg | ORAL_TABLET | Freq: Two times a day (BID) | ORAL | 0 refills | Status: DC

## 2024-02-03 NOTE — Telephone Encounter (Signed)
 Copied from CRM [id number]. Topic: Clinical - Medication Refill >> Feb 03, 2024 11:59 AM Carlatta H wrote: Medication: amphetamine -dextroamphetamine  (ADDERALL) 20 MG tablet HYDROcodone -acetaminophen  (NORCO) 5-325 MG tablet  Has the patient contacted their pharmacy? Yes (Agent: If no, request that the patient contact the pharmacy for the refill. If patient does not wish to contact the pharmacy document the reason why and proceed with request.) (Agent: If yes, when and what did the pharmacy advise?)Call the office  This is the patient's preferred pharmacy:  [facility] [id number] - [place], [place] - [address] [place] [address] Phone: [phone] Fax: [phone]  Is this the correct pharmacy for this prescription? Yes If no, delete pharmacy and type the correct one.   Has the prescription been filled recently? No  Is the patient out of the medication? Yes  Has the patient been seen for an appointment in the last year OR does the patient have an upcoming appointment? Yes  Can we respond through MyChart? Yes  Agent: Please be advised that Rx refills may take up to 3 business days. We ask that you follow-up with your pharmacy.

## 2024-02-08 ENCOUNTER — Encounter: Payer: Self-pay | Admitting: Family Medicine

## 2024-02-19 ENCOUNTER — Other Ambulatory Visit: Payer: Self-pay | Admitting: Family Medicine

## 2024-02-19 DIAGNOSIS — I10 Essential (primary) hypertension: Secondary | ICD-10-CM

## 2024-03-30 ENCOUNTER — Other Ambulatory Visit: Payer: Self-pay | Admitting: Family Medicine

## 2024-03-30 DIAGNOSIS — F909 Attention-deficit hyperactivity disorder, unspecified type: Secondary | ICD-10-CM

## 2024-03-30 NOTE — Telephone Encounter (Signed)
 Copied from CRM [id number]. Topic: Clinical - Medication Refill >> Mar 30, 2024  5:03 PM Delon HERO wrote: Medication: amphetamine -dextroamphetamine  (ADDERALL) 20 MG tablet [[id number]]  Has the patient contacted their pharmacy? Yes (Agent: If no, request that the patient contact the pharmacy for the refill. If patient does not wish to contact the pharmacy document the reason why and proceed with request.) (Agent: If yes, when and what did the pharmacy advise?)  This is the patient's preferred pharmacy:  [facility] [id number] - [place], [place] - [address] [place] [address] Phone: [phone] Fax: [phone]  Is this the correct pharmacy for this prescription? Yes If no, delete pharmacy and type the correct one.   Has the prescription been filled recently? Yes  Is the patient out of the medication? Yes  Has the patient been seen for an appointment in the last year OR does the patient have an upcoming appointment? Yes  Can we respond through MyChart? Yes  Agent: Please be advised that Rx refills may take up to 3 business days. We ask that you follow-up with your pharmacy.

## 2024-04-05 ENCOUNTER — Other Ambulatory Visit: Payer: Self-pay | Admitting: Nurse Practitioner

## 2024-04-05 DIAGNOSIS — K7469 Other cirrhosis of liver: Secondary | ICD-10-CM

## 2024-04-05 DIAGNOSIS — L821 Other seborrheic keratosis: Secondary | ICD-10-CM | POA: Diagnosis not present

## 2024-04-05 DIAGNOSIS — L719 Rosacea, unspecified: Secondary | ICD-10-CM | POA: Diagnosis not present

## 2024-04-05 DIAGNOSIS — L309 Dermatitis, unspecified: Secondary | ICD-10-CM | POA: Diagnosis not present

## 2024-04-05 DIAGNOSIS — D169 Benign neoplasm of bone and articular cartilage, unspecified: Secondary | ICD-10-CM | POA: Diagnosis not present

## 2024-04-05 DIAGNOSIS — L578 Other skin changes due to chronic exposure to nonionizing radiation: Secondary | ICD-10-CM | POA: Diagnosis not present

## 2024-04-05 DIAGNOSIS — D229 Melanocytic nevi, unspecified: Secondary | ICD-10-CM | POA: Diagnosis not present

## 2024-04-05 DIAGNOSIS — L814 Other melanin hyperpigmentation: Secondary | ICD-10-CM | POA: Diagnosis not present

## 2024-04-05 DIAGNOSIS — L304 Erythema intertrigo: Secondary | ICD-10-CM | POA: Diagnosis not present

## 2024-04-06 ENCOUNTER — Ambulatory Visit
Admission: RE | Admit: 2024-04-06 | Discharge: 2024-04-06 | Disposition: A | Discharge status: Home / Self Care | Source: Ambulatory Visit | Attending: Nurse Practitioner | Admitting: Nurse Practitioner

## 2024-04-06 DIAGNOSIS — K8020 Calculus of gallbladder without cholecystitis without obstruction: Secondary | ICD-10-CM | POA: Diagnosis not present

## 2024-04-06 DIAGNOSIS — K7469 Other cirrhosis of liver: Secondary | ICD-10-CM

## 2024-04-10 MED ORDER — AMPHETAMINE-DEXTROAMPHETAMINE 20 MG PO TABS
20.0000 mg | ORAL_TABLET | Freq: Two times a day (BID) | ORAL | 0 refills | Status: DC
Start: 2024-04-10 — End: 2024-06-09

## 2024-05-23 ENCOUNTER — Other Ambulatory Visit: Payer: Self-pay | Admitting: Family Medicine

## 2024-05-23 DIAGNOSIS — I10 Essential (primary) hypertension: Secondary | ICD-10-CM

## 2024-06-09 ENCOUNTER — Other Ambulatory Visit: Payer: Self-pay | Admitting: Family Medicine

## 2024-06-09 DIAGNOSIS — F909 Attention-deficit hyperactivity disorder, unspecified type: Secondary | ICD-10-CM

## 2024-06-09 NOTE — Telephone Encounter (Signed)
 Copied from CRM #[id number]. Topic: Clinical - Medication Refill >> Jun 09, 2024  1:25 PM Donee H wrote: Medication: amphetamine -dextroamphetamine  (ADDERALL) 20 MG tablet  Has the patient contacted their pharmacy? Yes, and wqas told to reach out to provider   This is the patient's preferred pharmacy:  [facility] [id number] - [place], [place] - [address] [place] [address] Phone: [phone] Fax: [phone]  Is this the correct pharmacy for this prescription? Yes   Has the prescription been filled recently? Yes  Is the patient out of the medication? No, but have about 3 left  Has the patient been seen for an appointment in the last year OR does the patient have an upcoming appointment? Yes  Can we respond through MyChart? Yes  Agent: Please be advised that Rx refills may take up to 3 business days. We ask that you follow-up with your pharmacy.

## 2024-06-12 MED ORDER — AMPHETAMINE-DEXTROAMPHETAMINE 20 MG PO TABS: 20.0000 mg | ORAL_TABLET | Freq: Two times a day (BID) | ORAL | 0 refills | Status: AC

## 2024-08-07 ENCOUNTER — Other Ambulatory Visit: Payer: Self-pay | Admitting: Family Medicine

## 2024-08-07 DIAGNOSIS — N200 Calculus of kidney: Secondary | ICD-10-CM

## 2024-08-07 MED ORDER — HYDROCODONE-ACETAMINOPHEN 5-325 MG PO TABS: 1.0000 | ORAL_TABLET | Freq: Four times a day (QID) | ORAL | 0 refills | Status: AC | PRN

## 2024-08-07 NOTE — Telephone Encounter (Signed)
 Copied from CRM [id number]. Topic: Clinical - Medication Refill >> Aug 07, 2024 11:59 AM Darshell M wrote: Medication: HYDROcodone -acetaminophen  (NORCO) 5-325 MG tablet  Has the patient contacted their pharmacy? Yes (Agent: If no, request that the patient contact the pharmacy for the refill. If patient does not wish to contact the pharmacy document the reason why and proceed with request.) (Agent: If yes, when and what did the pharmacy advise?)  This is the patient's preferred pharmacy:  [facility] [id number] - [place], [place] - [address] [place] [address] Phone: [phone] Fax: [phone]  Is this the correct pharmacy for this prescription? Yes If no, delete pharmacy and type the correct one.   Has the prescription been filled recently? No  Is the patient out of the medication? No  Has the patient been seen for an appointment in the last year OR does the patient have an upcoming appointment? Yes  Can we respond through MyChart? Yes  Agent: Please be advised that Rx refills may take up to 3 business days. We ask that you follow-up with your pharmacy.

## 2024-09-21 ENCOUNTER — Ambulatory Visit: Payer: Medicare Other | Admitting: Family Medicine

## 2024-10-17 ENCOUNTER — Ambulatory Visit: Payer: Self-pay | Admitting: Family Medicine
